# Patient Record
Sex: Male | Born: 1965 | Race: Black or African American | Hispanic: No | Marital: Married | Smoking: Former smoker
Health system: Southern US, Community
[De-identification: ages and names within clinical notes are randomized; demographics above are authoritative.]

## PROBLEM LIST (undated history)

## (undated) DIAGNOSIS — K8020 Calculus of gallbladder without cholecystitis without obstruction: Secondary | ICD-10-CM

## (undated) DIAGNOSIS — M1990 Unspecified osteoarthritis, unspecified site: Secondary | ICD-10-CM

## (undated) DIAGNOSIS — K859 Acute pancreatitis without necrosis or infection, unspecified: Secondary | ICD-10-CM

## (undated) DIAGNOSIS — I10 Essential (primary) hypertension: Secondary | ICD-10-CM

## (undated) DIAGNOSIS — E119 Type 2 diabetes mellitus without complications: Secondary | ICD-10-CM

## (undated) HISTORY — PX: NO PAST SURGERIES: SHX2092

## (undated) HISTORY — PX: ROTATOR CUFF REPAIR: SHX139

## (undated) HISTORY — DX: Type 2 diabetes mellitus without complications: E11.9

## (undated) HISTORY — DX: Acute pancreatitis without necrosis or infection, unspecified: K85.90

## (undated) HISTORY — DX: Essential (primary) hypertension: I10

---

## 2003-03-20 ENCOUNTER — Emergency Department (HOSPITAL_COMMUNITY): Admission: EM | Admit: 2003-03-20 | Discharge: 2003-03-20 | Payer: Self-pay | Admitting: Emergency Medicine

## 2004-06-26 ENCOUNTER — Emergency Department (HOSPITAL_COMMUNITY): Admission: EM | Admit: 2004-06-26 | Discharge: 2004-06-26 | Payer: Self-pay | Admitting: Emergency Medicine

## 2004-08-13 ENCOUNTER — Emergency Department (HOSPITAL_COMMUNITY): Admission: EM | Admit: 2004-08-13 | Discharge: 2004-08-14 | Payer: Self-pay | Admitting: Emergency Medicine

## 2004-11-02 ENCOUNTER — Ambulatory Visit: Payer: Self-pay | Admitting: Psychiatry

## 2004-11-02 ENCOUNTER — Inpatient Hospital Stay (HOSPITAL_COMMUNITY): Admission: RE | Admit: 2004-11-02 | Discharge: 2004-11-04 | Payer: Self-pay | Admitting: Psychiatry

## 2004-11-02 ENCOUNTER — Emergency Department (HOSPITAL_COMMUNITY): Admission: EM | Admit: 2004-11-02 | Discharge: 2004-11-02 | Payer: Self-pay | Admitting: Emergency Medicine

## 2004-11-22 ENCOUNTER — Emergency Department (HOSPITAL_COMMUNITY): Admission: EM | Admit: 2004-11-22 | Discharge: 2004-11-22 | Payer: Self-pay | Admitting: Emergency Medicine

## 2004-12-02 ENCOUNTER — Emergency Department (HOSPITAL_COMMUNITY): Admission: EM | Admit: 2004-12-02 | Discharge: 2004-12-02 | Payer: Self-pay | Admitting: Emergency Medicine

## 2011-09-13 ENCOUNTER — Emergency Department (HOSPITAL_COMMUNITY)
Admission: EM | Admit: 2011-09-13 | Discharge: 2011-09-13 | Disposition: A | Discharge status: Home / Self Care | Payer: Managed Care, Other (non HMO) | Attending: Emergency Medicine | Admitting: Emergency Medicine

## 2011-09-13 ENCOUNTER — Encounter: Payer: Self-pay | Admitting: Emergency Medicine

## 2011-09-13 DIAGNOSIS — R51 Headache: Secondary | ICD-10-CM | POA: Insufficient documentation

## 2011-09-13 MED ORDER — IBUPROFEN 800 MG PO TABS
800.0000 mg | ORAL_TABLET | Freq: Once | ORAL | Status: AC
  Administered 2011-09-13: 800 mg via ORAL
  Filled 2011-09-13: qty 1

## 2011-09-13 MED ORDER — HYDROCODONE-ACETAMINOPHEN 5-325 MG PO TABS: 1.0000 | ORAL_TABLET | Freq: Four times a day (QID) | ORAL | Status: AC | PRN

## 2011-09-13 NOTE — ED Provider Notes (Signed)
History     CSN: [id number] Arrival date & time: 09/13/2011  5:03 AM   First MD Initiated Contact with Patient 09/13/11 [phone]      Chief Complaint  Patient presents with  . Facial Pain    (Consider location/radiation/quality/duration/timing/severity/associated sxs/prior treatment) HPI This is a 45 year old black male who woke up about 3 AM with severe pain in the left side of his face. The pain was present from the forehead down to the mandible. The pain was of a throbbing nature. It was not exacerbated by palpation or movement of the jaw. There was no associated visual or motor deficit. The pain has subsequently subsided and essentially resolved now. He had a similar episode about 2 weeks ago. He has no history of migraine headaches. He does not have nasal congestion or toothache. He states he does sometimes bleed from his gums.  History reviewed. No pertinent past medical history.  History reviewed. No pertinent past surgical history.  No family history on file.  History  Substance Use Topics  . Smoking status: Current Everyday Smoker -- 1.0 packs/day    Types: Cigarettes  . Smokeless tobacco: Not on file  . Alcohol Use: Yes     occ      Review of Systems  All other systems reviewed and are negative.    Allergies  Review of patient's allergies indicates no known allergies.  Home Medications  No current outpatient prescriptions on file.  BP 151/89  Pulse 86  Temp(Src) 98.5 F (36.9 C) (Oral)  Resp 20  Ht 6\' 1"  (1.854 m)  Wt 300 lb (136.079 kg)  BMI 39.58 kg/m2  SpO2 96%  Physical Exam General: Well-developed, well-nourished male in no acute distress; appearance consistent with age of record HENT: normocephalic, atraumatic; no gross dental caries seen; no facial edema; no sinus tenderness on percussion; TMs normal, tympanostomy tube seen adhering to wax in right external auditory canal Eyes: pupils equal round and reactive to light; extraocular muscles  intact Neck: supple Heart: regular rate and rhythm Lungs: clear to auscultation bilaterally Abdomen: soft; nondistended Extremities: No deformity; full range of motion Neurologic: Awake, alert and oriented; motor function intact in all extremities and symmetric; no facial droop or hyperesthesia Skin: Warm and dry Psychiatric: Normal mood and affect    ED Course  Procedures (including critical care time)    MDM  Differential diagnosis includes atypical trigeminal neuralgia or migraine. No evidence of acute dental or sinus etiology as such pain would not be expected to be intermittent.         Hanley Seamen, MD 09/13/11 [phone]

## 2011-09-13 NOTE — ED Notes (Addendum)
Pt states R side facial pain ? Tooth vs sinus per Pt Pain comes and goes R side of face WNL Hurts worst when pt opens his mouth. Inside of mouth assessment WNL

## 2011-09-13 NOTE — ED Notes (Signed)
Patient states he woke up at 3am and the left side of his face hurt; states he doesn't know if a tooth is bad or if he has a sinus infection.

## 2011-09-13 NOTE — ED Notes (Signed)
Pt stable at discharge ambulatory with steady gait

## 2011-09-13 NOTE — ED Notes (Signed)
Pts pain at 8 Discussed with MD pt is driving, orders given will carry them through and discharge pt

## 2012-04-22 ENCOUNTER — Emergency Department (HOSPITAL_COMMUNITY)
Admission: EM | Admit: 2012-04-22 | Discharge: 2012-04-22 | Disposition: A | Discharge status: Home / Self Care | Payer: Managed Care, Other (non HMO) | Attending: Emergency Medicine | Admitting: Emergency Medicine

## 2012-04-22 ENCOUNTER — Encounter (HOSPITAL_COMMUNITY): Payer: Self-pay | Admitting: *Deleted

## 2012-04-22 DIAGNOSIS — J329 Chronic sinusitis, unspecified: Secondary | ICD-10-CM | POA: Insufficient documentation

## 2012-04-22 DIAGNOSIS — F17200 Nicotine dependence, unspecified, uncomplicated: Secondary | ICD-10-CM | POA: Insufficient documentation

## 2012-04-22 DIAGNOSIS — H109 Unspecified conjunctivitis: Secondary | ICD-10-CM | POA: Insufficient documentation

## 2012-04-22 MED ORDER — TOBRAMYCIN 0.3 % OP SOLN
2.0000 [drp] | Freq: Once | OPHTHALMIC | Status: AC
  Administered 2012-04-22: 2 [drp] via OPHTHALMIC
  Filled 2012-04-22: qty 5

## 2012-04-22 MED ORDER — AMOXICILLIN-POT CLAVULANATE 875-125 MG PO TABS
1.0000 | ORAL_TABLET | Freq: Once | ORAL | Status: AC
  Administered 2012-04-22: 1 via ORAL
  Filled 2012-04-22: qty 1

## 2012-04-22 MED ORDER — AMOXICILLIN-POT CLAVULANATE 875-125 MG PO TABS: 1.0000 | ORAL_TABLET | Freq: Two times a day (BID) | ORAL | Status: AC

## 2012-04-22 NOTE — ED Notes (Signed)
Pt states he has woke up past 3 days with eyes matted shut and drainage from both eyes.

## 2012-04-22 NOTE — ED Notes (Signed)
Patient with no complaints at this time. Respirations even and unlabored. Skin warm/dry. Discharge instructions reviewed with patient at this time. Patient given opportunity to voice concerns/ask questions. Patient discharged at this time and left Emergency Department with steady gait.   

## 2012-04-22 NOTE — ED Provider Notes (Signed)
History     CSN: [id number]  Arrival date & time 04/22/12  1147   First MD Initiated Contact with Patient 04/22/12 1229      Chief Complaint  Patient presents with  . Eye Drainage    (Consider location/radiation/quality/duration/timing/severity/associated sxs/prior treatment) HPI Comments: Pt has had sinus pressure and B eyes are draining and matting up the mornings.  Uses a warm wash cloth to "get my eyes open".  No fever or chills.  + post nasal drip.  The history is provided by the patient. No language interpreter was used.    History reviewed. No pertinent past medical history.  History reviewed. No pertinent past surgical history.  No family history on file.  History  Substance Use Topics  . Smoking status: Current Everyday Smoker -- 1.0 packs/day    Types: Cigarettes  . Smokeless tobacco: Not on file  . Alcohol Use: Yes     occ      Review of Systems  Constitutional: Negative for fever and chills.  HENT: Positive for postnasal drip.   Eyes: Positive for discharge and itching. Negative for photophobia, pain, redness and visual disturbance.  All other systems reviewed and are negative.    Allergies  Review of patient's allergies indicates no known allergies.  Home Medications   Current Outpatient Rx  Name Route Sig Dispense Refill  . BROMPHENIRAMINE-PSEUDOEPH 1-15 MG/5ML PO ELIX Oral Take 10 mLs by mouth 2 (two) times daily as needed. For cough/cold    . AMOXICILLIN-POT CLAVULANATE 875-125 MG PO TABS Oral Take 1 tablet by mouth every 12 (twelve) hours. 14 tablet 0    BP 146/97  Pulse 98  Temp 98.8 F (37.1 C) (Oral)  Resp 18  Ht 6\' 1"  (1.854 m)  Wt 330 lb (149.687 kg)  BMI 43.54 kg/m2  Physical Exam  Nursing note and vitals reviewed. Constitutional: He is oriented to person, place, and time. He appears well-developed and well-nourished.  HENT:  Head: Normocephalic and atraumatic.         Pt's voice sounds very nasal.    Frequently sniffing to  clear post nasal d/c   Eyes: Conjunctivae, EOM and lids are normal. Pupils are equal, round, and reactive to light. Right eye exhibits no discharge and no exudate. Left eye exhibits no discharge and no exudate. No scleral icterus.       Pt describes purulent d/c in mornings that he uses a wash cloth to remove.  No d/c at exam time.  Neck: Normal range of motion.  Cardiovascular: Normal rate, regular rhythm, normal heart sounds and intact distal pulses.   Pulmonary/Chest: Effort normal and breath sounds normal. No respiratory distress.  Abdominal: Soft. He exhibits no distension. There is no tenderness.  Musculoskeletal: Normal range of motion.  Neurological: He is alert and oriented to person, place, and time.  Skin: Skin is warm and dry.  Psychiatric: He has a normal mood and affect. Judgment normal.    ED Course  Procedures (including critical care time)  Labs Reviewed - No data to display No results found.   1. Sinusitis   2. Conjunctivitis of both eyes       MDM  Seasonal allergies vs sinusitis.        Evalina Field, PA 04/22/12 1417  Evalina Field, PA 04/22/12 1648  Evalina Field, PA 04/22/12 [phone]

## 2012-04-22 NOTE — ED Notes (Signed)
Pt. Presents w/1 wk of sinus congestion and 3-4 days of eye drainage that webs eyelids shut in the morning. Also has had productive cough of light green thick secretions.   Warm wet rag works for removing drainage.  Has taken Dimetapp for congestion, but has not done much for relief of symptoms.

## 2012-04-23 NOTE — ED Provider Notes (Signed)
Medical screening examination/treatment/procedure(s) were performed by non-physician practitioner and as supervising physician I was immediately available for consultation/collaboration.   Joya Gaskins, MD 04/23/12 [phone]

## 2013-05-08 ENCOUNTER — Emergency Department (HOSPITAL_COMMUNITY)
Admission: EM | Admit: 2013-05-08 | Discharge: 2013-05-08 | Disposition: A | Discharge status: Home / Self Care | Payer: 59 | Attending: Emergency Medicine | Admitting: Emergency Medicine

## 2013-05-08 ENCOUNTER — Emergency Department (HOSPITAL_COMMUNITY): Payer: 59

## 2013-05-08 ENCOUNTER — Encounter (HOSPITAL_COMMUNITY): Payer: Self-pay | Admitting: Emergency Medicine

## 2013-05-08 DIAGNOSIS — F17200 Nicotine dependence, unspecified, uncomplicated: Secondary | ICD-10-CM | POA: Insufficient documentation

## 2013-05-08 DIAGNOSIS — M549 Dorsalgia, unspecified: Secondary | ICD-10-CM | POA: Insufficient documentation

## 2013-05-08 DIAGNOSIS — K8020 Calculus of gallbladder without cholecystitis without obstruction: Secondary | ICD-10-CM | POA: Insufficient documentation

## 2013-05-08 DIAGNOSIS — R0602 Shortness of breath: Secondary | ICD-10-CM | POA: Insufficient documentation

## 2013-05-08 LAB — CBC WITH DIFFERENTIAL/PLATELET
Basophils Absolute: 0 10*3/uL (ref 0.0–0.1)
Basophils Relative: 0 % (ref 0–1)
Eosinophils Absolute: 0.1 10*3/uL (ref 0.0–0.7)
Eosinophils Relative: 0 % (ref 0–5)
HCT: 44.4 % (ref 39.0–52.0)
Hemoglobin: 15 g/dL (ref 13.0–17.0)
Lymphocytes Relative: 10 % — ABNORMAL LOW (ref 12–46)
Lymphs Abs: 1.3 10*3/uL (ref 0.7–4.0)
MCH: 28.2 pg (ref 26.0–34.0)
MCHC: 33.8 g/dL (ref 30.0–36.0)
MCV: 83.5 fL (ref 78.0–100.0)
Monocytes Absolute: 0.6 10*3/uL (ref 0.1–1.0)
Monocytes Relative: 4 % (ref 3–12)
Neutro Abs: 10.8 10*3/uL — ABNORMAL HIGH (ref 1.7–7.7)
Neutrophils Relative %: 85 % — ABNORMAL HIGH (ref 43–77)
Platelets: 256 10*3/uL (ref 150–400)
RBC: 5.32 MIL/uL (ref 4.22–5.81)
RDW: 14.6 % (ref 11.5–15.5)
WBC: 12.7 10*3/uL — ABNORMAL HIGH (ref 4.0–10.5)

## 2013-05-08 LAB — COMPREHENSIVE METABOLIC PANEL
ALT: 628 U/L — ABNORMAL HIGH (ref 0–53)
AST: 755 U/L — ABNORMAL HIGH (ref 0–37)
Albumin: 4.1 g/dL (ref 3.5–5.2)
Alkaline Phosphatase: 193 U/L — ABNORMAL HIGH (ref 39–117)
BUN: 12 mg/dL (ref 6–23)
CO2: 26 mEq/L (ref 19–32)
Calcium: 9.9 mg/dL (ref 8.4–10.5)
Chloride: 100 mEq/L (ref 96–112)
Creatinine, Ser: 0.9 mg/dL (ref 0.50–1.35)
GFR calc Af Amer: >90 mL/min (ref >90)
GFR calc non Af Amer: >90 mL/min (ref >90)
Glucose, Bld: 132 mg/dL — ABNORMAL HIGH (ref 70–99)
Potassium: 3.8 mEq/L (ref 3.5–5.1)
Sodium: 136 mEq/L (ref 135–145)
Total Bilirubin: 1.2 mg/dL (ref 0.3–1.2)
Total Protein: 8.6 g/dL — ABNORMAL HIGH (ref 6.0–8.3)

## 2013-05-08 LAB — TROPONIN I: Troponin I: <0.3 ng/mL (ref <0.30)

## 2013-05-08 LAB — LIPASE, BLOOD: Lipase: 17 U/L (ref 11–59)

## 2013-05-08 MED ORDER — ASPIRIN 81 MG PO CHEW
324.0000 mg | CHEWABLE_TABLET | Freq: Once | ORAL | Status: AC
  Administered 2013-05-08: 324 mg via ORAL
  Filled 2013-05-08: qty 4

## 2013-05-08 MED ORDER — TRAMADOL HCL 50 MG PO TABS: 50.0000 mg | ORAL_TABLET | Freq: Four times a day (QID) | ORAL | Status: DC | PRN

## 2013-05-08 MED ORDER — ONDANSETRON 4 MG PO TBDP
ORAL_TABLET | ORAL | Status: AC
  Administered 2013-05-08: 4 mg via ORAL
  Filled 2013-05-08: qty 1

## 2013-05-08 MED ORDER — SODIUM CHLORIDE 0.9 % IV SOLN
INTRAVENOUS | Status: DC
  Administered 2013-05-08: 11:00:00 via INTRAVENOUS

## 2013-05-08 MED ORDER — PROMETHAZINE HCL 25 MG PO TABS: 25.0000 mg | ORAL_TABLET | Freq: Four times a day (QID) | ORAL | Status: DC | PRN

## 2013-05-08 MED ORDER — SODIUM CHLORIDE 0.9 % IV BOLUS (SEPSIS): 500.0000 mL | Freq: Once | INTRAVENOUS | Status: DC

## 2013-05-08 MED ORDER — HYDROMORPHONE HCL PF 1 MG/ML IJ SOLN
1.0000 mg | Freq: Once | INTRAMUSCULAR | Status: DC
  Filled 2013-05-08: qty 1

## 2013-05-08 MED ORDER — HYDROMORPHONE HCL PF 1 MG/ML IJ SOLN
1.0000 mg | Freq: Once | INTRAMUSCULAR | Status: AC
  Administered 2013-05-08: 1 mg via INTRAMUSCULAR

## 2013-05-08 MED ORDER — PROMETHAZINE HCL 25 MG/ML IJ SOLN: 12.5000 mg | Freq: Once | INTRAMUSCULAR | Status: DC

## 2013-05-08 MED ORDER — ONDANSETRON HCL 4 MG/2ML IJ SOLN
4.0000 mg | Freq: Once | INTRAMUSCULAR | Status: DC
  Filled 2013-05-08: qty 2

## 2013-05-08 MED ORDER — ONDANSETRON 4 MG PO TBDP
4.0000 mg | ORAL_TABLET | Freq: Once | ORAL | Status: AC
  Administered 2013-05-08: 4 mg via ORAL

## 2013-05-08 NOTE — ED Notes (Signed)
CRITICAL VALUE ALERT   Critical value received: trop 0.32  Date of notification:  05/08/2013  Time of notification:  10:51  Critical value read back: yes  Nurse who received alert:  Juliette Alcide   MD notified (1st page):  Dr. Deretha Emory  Time of first page:  10:51  MD notified (2nd page):  Time of second page:  Responding MD:  Dr. Deretha Emory  Time MD responded:  10:51

## 2013-05-08 NOTE — ED Notes (Addendum)
Dr. Deretha Emory notified of erroneous Troponin results. Sample retested w/result of 0.03.

## 2013-05-08 NOTE — ED Notes (Signed)
Patient with no complaints at this time. Respirations even and unlabored. Skin warm/dry. Discharge instructions reviewed with patient at this time. Patient given opportunity to voice concerns/ask questions. IV removed per policy and band-aid applied to site. Patient discharged at this time and left Emergency Department with steady gait.  

## 2013-05-08 NOTE — ED Notes (Signed)
Pt c/o mid abd pain started last night that radiates around back with sob. Denies n/v/d. lnbm last night. Denies black or bloody stools. Denies urinary changes/cough/congestion/dizziness. C/o soreness and pressure to abd.

## 2013-05-08 NOTE — ED Provider Notes (Addendum)
History  This chart was scribed for Shelda Jakes, MD, by Yevette Edwards, ED Scribe. This patient was seen in room [facility] and the patient's care was started at 9:25 AM.   CSN: [id number] Arrival date & time 05/08/13  0909  First MD Initiated Contact with Patient 05/08/13 [phone]     Chief Complaint  Patient presents with  . Abdominal Pain    Patient is a 47 y.o. male presenting with abdominal pain. The history is provided by the patient. No language interpreter was used.  Abdominal Pain This is a new problem. The current episode started 12 to 24 hours ago. The problem occurs constantly. The problem has been gradually worsening. Associated symptoms include abdominal pain and shortness of breath. Pertinent negatives include no chest pain and no headaches. Nothing aggravates the symptoms. Nothing relieves the symptoms. He has tried nothing for the symptoms.   HPI Comments: MAYFORD ALBERG is a 47 y.o. male who presents to the Emergency Department complaining of constant, gradually- increasing upper abdominal pain which began approximately at 11 pm yesterday evening. He states that the abdominal pain radiates to his back, and he describes the pain as "soreness" and "pressure."  He also states that he has experienced SOB when he takes a deep breath as an associated symptom. The pt denies experiencing any emesis, diarrhea, dysuria, hematochezia, cough, congestion, or dizziness. The pt has a h/o prior episodes of similar symptoms, and he states that he was told the previous episodes were caused by reflux. However, he denies being presrcibed any reflux medicine. He also denies having any imaging of his gallbladder. The pt is a daily smoker, and he uses alcohol.    Terie Purser with [facility] is Pt's P.A.   History reviewed. No pertinent past medical history. History reviewed. No pertinent past surgical history. History reviewed. No pertinent family history. History  Substance Use  Topics  . Smoking status: Current Every Day Smoker -- 1.00 packs/day    Types: Cigarettes  . Smokeless tobacco: Not on file  . Alcohol Use: Yes     Comment: occ    Review of Systems  Constitutional: Negative for fever and chills.  HENT: Negative for congestion, sore throat and rhinorrhea.   Respiratory: Positive for shortness of breath. Negative for cough.   Cardiovascular: Negative for chest pain and leg swelling.  Gastrointestinal: Positive for abdominal pain. Negative for nausea, vomiting, diarrhea and blood in stool.  Genitourinary: Negative for dysuria.  Musculoskeletal: Positive for back pain.  Skin: Negative for rash.  Neurological: Negative for dizziness and headaches.  Hematological: Does not bruise/bleed easily.  All other systems reviewed and are negative.    Allergies  Review of patient's allergies indicates no known allergies.  Home Medications   Current Outpatient Rx  Name  Route  Sig  Dispense  Refill  . promethazine (PHENERGAN) 25 MG tablet   Oral   Take 1 tablet (25 mg total) by mouth every 6 (six) hours as needed for nausea.   12 tablet   0   . traMADol (ULTRAM) 50 MG tablet   Oral   Take 1 tablet (50 mg total) by mouth every 6 (six) hours as needed.   20 tablet   0    Triage Vitals: BP 135/75  Pulse 80  Temp(Src) 97.7 F (36.5 C) (Oral)  Resp 14  Ht 6' (1.829 m)  Wt 280 lb (127.007 kg)  BMI 37.97 kg/m2  SpO2 100%  Physical Exam  Nursing note  and vitals reviewed. Constitutional: He is oriented to person, place, and time. He appears well-developed and well-nourished. No distress.  HENT:  Head: Normocephalic and atraumatic.  Eyes: EOM are normal.  Neck: Neck supple. No tracheal deviation present.  Cardiovascular: Normal rate, regular rhythm and normal heart sounds.   No murmur heard. Pulmonary/Chest: Effort normal and breath sounds normal. No respiratory distress.  Abdominal: Soft. Bowel sounds are normal. There is tenderness.  Bowel  sounds decreased, but present. Tenderness to bilateral upper quadrant abdominal.  Musculoskeletal: Normal range of motion.  Neurological: He is alert and oriented to person, place, and time. He has normal reflexes. He displays normal reflexes. No cranial nerve deficit. Coordination normal.  Skin: Skin is warm and dry.  Psychiatric: He has a normal mood and affect. His behavior is normal.    ED Course  Procedures (including critical care time)  DIAGNOSTIC STUDIES: Oxygen Saturation is 100% on room air, normal by my interpretation.    COORDINATION OF CARE:  9:31 AM-Discussed treatment plan with patient which includes imaging and pain medication, and the patient agreed to the plan.   12:08 PM- Rechecked pt. Informed him of this results.   Labs Reviewed  COMPREHENSIVE METABOLIC PANEL - Abnormal; Notable for the following:    Glucose, Bld 132 (*)    Total Protein 8.6 (*)    AST 755 (*)    ALT 628 (*)    Alkaline Phosphatase 193 (*)    All other components within normal limits  CBC WITH DIFFERENTIAL - Abnormal; Notable for the following:    WBC 12.7 (*)    Neutrophils Relative % 85 (*)    Neutro Abs 10.8 (*)    Lymphocytes Relative 10 (*)    All other components within normal limits  LIPASE, BLOOD  TROPONIN I  TROPONIN I   Dg Chest 2 View  05/08/2013   *RADIOLOGY REPORT*  Clinical Data: abdominal pain, shortness of breath  CHEST - 2 VIEW  Comparison: None.  Findings: Heart is normal size.  Mild peribronchial thickening.  No confluent opacities or effusions.  No acute bony abnormality.  IMPRESSION: Mild bronchitic changes.   Original Report Authenticated By: Charlett Nose, M.D.   US Abdomen Complete  05/08/2013   *RADIOLOGY REPORT*  Clinical Data:  Abdominal pain  ABDOMINAL ULTRASOUND COMPLETE  Comparison:  None.  Findings:  Gallbladder:  Numerous echogenic shadowing intraluminal gallstones. Wall thickness is mildly thickened measuring 3.9 mm.  Despite this, no Murphy's sign elicited  during the study.  Common Bile Duct:  Within normal limits in caliber.  Liver: Mild increase echogenicity.  Compatible with fatty infiltration.  No focal hepatic abnormality or biliary dilatation. Portal vein is patent with normal hepato pedal flow.  IVC:  Appears normal.  Pancreas:  No abnormality identified.  Spleen:  Within normal limits in size and echotexture.  Right kidney:  Normal in size and parenchymal echogenicity.  No evidence of mass or hydronephrosis.  Left kidney:  Normal in size and parenchymal echogenicity.  No evidence of mass or hydronephrosis.  Abdominal Aorta:  No aneurysm identified.  IMPRESSION: Cholelithiasis with slight gallbladder wall thickening but no Murphy's sign.  Hepatic steatosis  No biliary dilatation   Original Report Authenticated By: Judie Petit. Miles Costain, M.D.     Date: 05/08/2013  Rate: 77  Rhythm: normal sinus rhythm  QRS Axis: normal  Intervals: normal  ST/T Wave abnormalities: nonspecific T wave changes  Conduction Disutrbances:none  Narrative Interpretation:   Old EKG Reviewed: none available  Results for orders placed during the hospital encounter of 05/08/13  COMPREHENSIVE METABOLIC PANEL      Result Value Range   Sodium 136  135 - 145 mEq/L   Potassium 3.8  3.5 - 5.1 mEq/L   Chloride 100  96 - 112 mEq/L   CO2 26  19 - 32 mEq/L   Glucose, Bld 132 (*) 70 - 99 mg/dL   BUN 12  6 - 23 mg/dL   Creatinine, Ser 1.19  0.50 - 1.35 mg/dL   Calcium 9.9  8.4 - 14.7 mg/dL   Total Protein 8.6 (*) 6.0 - 8.3 g/dL   Albumin 4.1  3.5 - 5.2 g/dL   AST 829 (*) 0 - 37 U/L   ALT 628 (*) 0 - 53 U/L   Alkaline Phosphatase 193 (*) 39 - 117 U/L   Total Bilirubin 1.2  0.3 - 1.2 mg/dL   GFR calc non Af Amer >90  >90 mL/min   GFR calc Af Amer >90  >90 mL/min  LIPASE, BLOOD      Result Value Range   Lipase 17  11 - 59 U/L  CBC WITH DIFFERENTIAL      Result Value Range   WBC 12.7 (*) 4.0 - 10.5 K/uL   RBC 5.32  4.22 - 5.81 MIL/uL   Hemoglobin 15.0  13.0 - 17.0 g/dL   HCT 56.2   13.0 - 86.5 %   MCV 83.5  78.0 - 100.0 fL   MCH 28.2  26.0 - 34.0 pg   MCHC 33.8  30.0 - 36.0 g/dL   RDW 78.4  69.6 - 29.5 %   Platelets 256  150 - 400 K/uL   Neutrophils Relative % 85 (*) 43 - 77 %   Neutro Abs 10.8 (*) 1.7 - 7.7 K/uL   Lymphocytes Relative 10 (*) 12 - 46 %   Lymphs Abs 1.3  0.7 - 4.0 K/uL   Monocytes Relative 4  3 - 12 %   Monocytes Absolute 0.6  0.1 - 1.0 K/uL   Eosinophils Relative 0  0 - 5 %   Eosinophils Absolute 0.1  0.0 - 0.7 K/uL   Basophils Relative 0  0 - 1 %   Basophils Absolute 0.0  0.0 - 0.1 K/uL  TROPONIN I      Result Value Range   Troponin I <0.30  <0.30 ng/mL       1. Cholelithiases     MDM  Patient symptoms consistent with biliary colic ultrasound confirms gallstones with gallbladder thickening. No elevation in bilirubin no elevation in lipase no evidence of pancreatitis. Patient's pain is resolved abdomen is soft and nontender. Patient can followup with general surgery for elect removal of the gallbladder. The concern was that the patient's troponin was originally reported as elevated clinically do not make sense was going to repeat it but they determined that the first troponin was a runny [place] and debris Harvie Heck original blood and the troponin was negative.  Elevation in transaminases noted. Do not feel that this is consistent with hepatitis.   I personally performed the services described in this documentation, which was scribed in my presence. The recorded information has been reviewed and is accurate.     Shelda Jakes, MD 05/08/13 1216  Shelda Jakes, MD 05/08/13 [phone]

## 2013-06-21 NOTE — Patient Instructions (Addendum)
Timothy Dalton  06/21/2013   Your procedure is scheduled on:   06/29/2013  Report to [facility] at  615  AM.  Call this number if you have problems the morning of surgery: [phone]   Remember:   Do not eat food or drink liquids after midnight.   Take these medicines the morning of surgery with A SIP OF WATER:  Phnergan, ultram   Do not wear jewelry, make-up or nail polish.  Do not wear lotions, powders, or perfumes.   Do not shave 48 hours prior to surgery. Men may shave face and neck.  Do not bring valuables to the hospital.  [facility] is not responsible  for any belongings or valuables.  Contacts, dentures or bridgework may not be worn into surgery.  Leave suitcase in the car. After surgery it may be brought to your room.  For patients admitted to the hospital, checkout time is 11:00 AM the day of discharge.   Patients discharged the day of surgery will not be allowed to drive home.  Name and phone number of your driver: family  Special Instructions: Shower using CHG 2 nights before surgery and the night before surgery.  If you shower the day of surgery use CHG.  Use special wash - you have one bottle of CHG for all showers.  You should use approximately 1/3 of the bottle for each shower.   Please read over the following fact sheets that you were given: Pain Booklet, Coughing and Deep Breathing, Surgical Site Infection Prevention, Anesthesia Post-op Instructions and Care and Recovery After Surgery Laparoscopic Cholecystectomy Laparoscopic cholecystectomy is surgery to remove the gallbladder. The gallbladder is located slightly to the right of center in the abdomen, behind the liver. It is a concentrating and storage sac for the bile produced in the liver. Bile aids in the digestion and absorption of fats. Gallbladder disease (cholecystitis) is an inflammation of your gallbladder. This condition is usually caused by a buildup of gallstones (cholelithiasis) in your gallbladder.  Gallstones can block the flow of bile, resulting in inflammation and pain. In severe cases, emergency surgery may be required. When emergency surgery is not required, you will have time to prepare for the procedure. Laparoscopic surgery is an alternative to open surgery. Laparoscopic surgery usually has a shorter recovery time. Your common bile duct may also need to be examined and explored. Your caregiver will discuss this with you if he or she feels this should be done. If stones are found in the common bile duct, they may be removed. LET YOUR CAREGIVER KNOW ABOUT:  Allergies to food or medicine.  Medicines taken, including vitamins, herbs, eyedrops, over-the-counter medicines, and creams.  Use of steroids (by mouth or creams).  Previous problems with anesthetics or numbing medicines.  History of bleeding problems or blood clots.  Previous surgery.  Other health problems, including diabetes and kidney problems.  Possibility of pregnancy, if this applies. RISKS AND COMPLICATIONS All surgery is associated with risks. Some problems that may occur following this procedure include:  Infection.  Damage to the common bile duct, nerves, arteries, veins, or other internal organs such as the stomach or intestines.  Bleeding.  A stone may remain in the common bile duct. BEFORE THE PROCEDURE  Do not take aspirin for 3 days prior to surgery or blood thinners for 1 week prior to surgery.  Do not eat or drink anything after midnight the night before surgery.  Let your caregiver  know if you develop a cold or other infectious problem prior to surgery.  You should be present 60 minutes before the procedure or as directed. PROCEDURE  You will be given medicine that makes you sleep (general anesthetic). When you are asleep, your surgeon will make several small cuts (incisions) in your abdomen. One of these incisions is used to insert a small, lighted scope (laparoscope) into the abdomen. The  laparoscope helps the surgeon see into your abdomen. Carbon dioxide gas will be pumped into your abdomen. The gas allows more room for the surgeon to perform your surgery. Other operating instruments are inserted through the other incisions. Laparoscopic procedures may not be appropriate when:  There is major scarring from previous surgery.  The gallbladder is extremely inflamed.  There are bleeding disorders or unexpected cirrhosis of the liver.  A pregnancy is near term.  Other conditions make the laparoscopic procedure impossible. If your surgeon feels it is not safe to continue with a laparoscopic procedure, he or she will perform an open abdominal procedure. In this case, the surgeon will make an incision to open the abdomen. This gives the surgeon a larger view and field to work within. This may allow the surgeon to perform procedures that sometimes cannot be performed with a laparoscope alone. Open surgery has a longer recovery time. AFTER THE PROCEDURE  You will be taken to the recovery area where a nurse will watch and check your progress.  You may be allowed to go home the same day.  Do not resume physical activities until directed by your caregiver.  You may resume a normal diet and activities as directed. Document Released: 10/04/2005 Document Revised: 12/27/2011 Document Reviewed: 03/19/2011 [facility] Patient Information 2014 [place], [place]. PATIENT INSTRUCTIONS POST-ANESTHESIA  IMMEDIATELY FOLLOWING SURGERY:  Do not drive or operate machinery for the first twenty four hours after surgery.  Do not make any important decisions for twenty four hours after surgery or while taking narcotic pain medications or sedatives.  If you develop intractable nausea and vomiting or a severe headache please notify your doctor immediately.  FOLLOW-UP:  Please make an appointment with your surgeon as instructed. You do not need to follow up with anesthesia unless specifically instructed to do  so.  WOUND CARE INSTRUCTIONS (if applicable):  Keep a dry clean dressing on the anesthesia/puncture wound site if there is drainage.  Once the wound has quit draining you may leave it open to air.  Generally you should leave the bandage intact for twenty four hours unless there is drainage.  If the epidural site drains for more than 36-48 hours please call the anesthesia department.  QUESTIONS?:  Please feel free to call your physician or the hospital operator if you have any questions, and they will be happy to assist you.

## 2013-06-22 ENCOUNTER — Encounter (HOSPITAL_COMMUNITY)
Admission: RE | Admit: 2013-06-22 | Discharge: 2013-06-22 | Disposition: A | Discharge status: Home / Self Care | Payer: Managed Care, Other (non HMO) | Source: Ambulatory Visit | Attending: Family Medicine | Admitting: Family Medicine

## 2013-06-29 ENCOUNTER — Ambulatory Visit (HOSPITAL_COMMUNITY): Admission: RE | Admit: 2013-06-29 | Payer: 59 | Source: Ambulatory Visit | Admitting: General Surgery

## 2013-06-29 ENCOUNTER — Encounter (HOSPITAL_COMMUNITY): Admission: RE | Payer: Self-pay | Source: Ambulatory Visit

## 2013-06-29 SURGERY — LAPAROSCOPIC CHOLECYSTECTOMY
Anesthesia: General

## 2016-04-22 ENCOUNTER — Encounter (HOSPITAL_COMMUNITY): Payer: Self-pay | Admitting: Emergency Medicine

## 2016-04-22 ENCOUNTER — Emergency Department (HOSPITAL_COMMUNITY): Payer: BLUE CROSS/BLUE SHIELD

## 2016-04-22 ENCOUNTER — Inpatient Hospital Stay (HOSPITAL_COMMUNITY)
Admission: EM | Admit: 2016-04-22 | Discharge: 2016-04-24 | DRG: 439 | Disposition: A | Discharge status: Home / Self Care | Payer: BLUE CROSS/BLUE SHIELD | Attending: Internal Medicine | Admitting: Internal Medicine

## 2016-04-22 DIAGNOSIS — D649 Anemia, unspecified: Secondary | ICD-10-CM | POA: Diagnosis present

## 2016-04-22 DIAGNOSIS — K852 Alcohol induced acute pancreatitis without necrosis or infection: Secondary | ICD-10-CM | POA: Diagnosis not present

## 2016-04-22 DIAGNOSIS — R1013 Epigastric pain: Secondary | ICD-10-CM | POA: Diagnosis not present

## 2016-04-22 DIAGNOSIS — K859 Acute pancreatitis without necrosis or infection, unspecified: Secondary | ICD-10-CM | POA: Diagnosis present

## 2016-04-22 DIAGNOSIS — Z6841 Body Mass Index (BMI) 40.0 and over, adult: Secondary | ICD-10-CM

## 2016-04-22 DIAGNOSIS — R10816 Epigastric abdominal tenderness: Secondary | ICD-10-CM | POA: Diagnosis not present

## 2016-04-22 DIAGNOSIS — F17210 Nicotine dependence, cigarettes, uncomplicated: Secondary | ICD-10-CM | POA: Diagnosis present

## 2016-04-22 DIAGNOSIS — K851 Biliary acute pancreatitis without necrosis or infection: Secondary | ICD-10-CM | POA: Diagnosis present

## 2016-04-22 DIAGNOSIS — K8511 Biliary acute pancreatitis with uninfected necrosis: Secondary | ICD-10-CM | POA: Diagnosis not present

## 2016-04-22 DIAGNOSIS — R7989 Other specified abnormal findings of blood chemistry: Secondary | ICD-10-CM | POA: Diagnosis not present

## 2016-04-22 DIAGNOSIS — R945 Abnormal results of liver function studies: Secondary | ICD-10-CM

## 2016-04-22 DIAGNOSIS — K858 Other acute pancreatitis without necrosis or infection: Secondary | ICD-10-CM | POA: Diagnosis not present

## 2016-04-22 DIAGNOSIS — K8020 Calculus of gallbladder without cholecystitis without obstruction: Secondary | ICD-10-CM | POA: Diagnosis present

## 2016-04-22 HISTORY — DX: Calculus of gallbladder without cholecystitis without obstruction: K80.20

## 2016-04-22 LAB — URINE MICROSCOPIC-ADD ON

## 2016-04-22 LAB — URINALYSIS, ROUTINE W REFLEX MICROSCOPIC
GLUCOSE, UA: NEGATIVE mg/dL
Ketones, ur: NEGATIVE mg/dL
LEUKOCYTES UA: NEGATIVE
Nitrite: NEGATIVE
PH: 5.5 (ref 5.0–8.0)
Specific Gravity, Urine: 1.025 (ref 1.005–1.030)

## 2016-04-22 LAB — I-STAT CG4 LACTIC ACID, ED
Lactic Acid, Venous: 1.23 mmol/L (ref 0.5–1.9)
Lactic Acid, Venous: 0.8 mmol/L (ref 0.5–1.9)

## 2016-04-22 LAB — COMPREHENSIVE METABOLIC PANEL
ALBUMIN: 3.9 g/dL (ref 3.5–5.0)
ALT: 578 U/L — ABNORMAL HIGH (ref 17–63)
AST: 451 U/L — AB (ref 15–41)
Alkaline Phosphatase: 169 U/L — ABNORMAL HIGH (ref 38–126)
Anion gap: 8 (ref 5–15)
BILIRUBIN TOTAL: 4.1 mg/dL — AB (ref 0.3–1.2)
BUN: 13 mg/dL (ref 6–20)
CHLORIDE: 103 mmol/L (ref 101–111)
CO2: 27 mmol/L (ref 22–32)
Calcium: 9.5 mg/dL (ref 8.9–10.3)
Creatinine, Ser: 0.77 mg/dL (ref 0.61–1.24)
GFR calc Af Amer: >60 mL/min (ref >60)
GFR calc non Af Amer: >60 mL/min (ref >60)
GLUCOSE: 105 mg/dL — AB (ref 65–99)
POTASSIUM: 3.4 mmol/L — AB (ref 3.5–5.1)
Sodium: 138 mmol/L (ref 135–145)
TOTAL PROTEIN: 8 g/dL (ref 6.5–8.1)

## 2016-04-22 LAB — CBC WITH DIFFERENTIAL/PLATELET
Basophils Absolute: 0 10*3/uL (ref 0.0–0.1)
Basophils Relative: 0 %
Eosinophils Absolute: 0.1 10*3/uL (ref 0.0–0.7)
Eosinophils Relative: 0 %
HEMATOCRIT: 44.3 % (ref 39.0–52.0)
HEMOGLOBIN: 14.6 g/dL (ref 13.0–17.0)
LYMPHS ABS: 1.3 10*3/uL (ref 0.7–4.0)
Lymphocytes Relative: 9 %
MCH: 27.9 pg (ref 26.0–34.0)
MCHC: 33 g/dL (ref 30.0–36.0)
MCV: 84.5 fL (ref 78.0–100.0)
MONOS PCT: 4 %
Monocytes Absolute: 0.6 10*3/uL (ref 0.1–1.0)
NEUTROS ABS: 12 10*3/uL — AB (ref 1.7–7.7)
NEUTROS PCT: 87 %
Platelets: 274 10*3/uL (ref 150–400)
RBC: 5.24 MIL/uL (ref 4.22–5.81)
RDW: 15.1 % (ref 11.5–15.5)
WBC: 13.9 10*3/uL — ABNORMAL HIGH (ref 4.0–10.5)

## 2016-04-22 LAB — LIPASE, BLOOD: Lipase: 704 U/L — ABNORMAL HIGH (ref 11–51)

## 2016-04-22 MED ORDER — MORPHINE SULFATE (PF) 4 MG/ML IV SOLN
4.0000 mg | INTRAVENOUS | Status: DC | PRN
  Administered 2016-04-22 – 2016-04-24 (×5): 4 mg via INTRAVENOUS
  Filled 2016-04-22 (×6): qty 1

## 2016-04-22 MED ORDER — SODIUM CHLORIDE 0.9 % IV SOLN
INTRAVENOUS | Status: DC
  Administered 2016-04-22: 21:00:00 via INTRAVENOUS

## 2016-04-22 MED ORDER — SODIUM CHLORIDE 0.9 % IV SOLN
Freq: Once | INTRAVENOUS | Status: AC
  Administered 2016-04-22: 17:00:00 via INTRAVENOUS

## 2016-04-22 MED ORDER — ONDANSETRON HCL 4 MG/2ML IJ SOLN
4.0000 mg | Freq: Once | INTRAMUSCULAR | Status: AC
  Administered 2016-04-22: 4 mg via INTRAVENOUS
  Filled 2016-04-22: qty 2

## 2016-04-22 MED ORDER — MORPHINE SULFATE (PF) 4 MG/ML IV SOLN
4.0000 mg | Freq: Once | INTRAVENOUS | Status: AC
  Administered 2016-04-22: 4 mg via INTRAVENOUS
  Filled 2016-04-22: qty 1

## 2016-04-22 MED ORDER — HEPARIN SODIUM (PORCINE) 5000 UNIT/ML IJ SOLN
5000.0000 [IU] | Freq: Three times a day (TID) | INTRAMUSCULAR | Status: DC
  Administered 2016-04-22 – 2016-04-23 (×4): 5000 [IU] via SUBCUTANEOUS
  Filled 2016-04-22 (×4): qty 1

## 2016-04-22 MED ORDER — SODIUM CHLORIDE 0.9 % IV BOLUS (SEPSIS)
1000.0000 mL | Freq: Once | INTRAVENOUS | Status: AC
  Administered 2016-04-22: 1000 mL via INTRAVENOUS

## 2016-04-22 MED ORDER — ONDANSETRON HCL 4 MG PO TABS: 4.0000 mg | ORAL_TABLET | Freq: Four times a day (QID) | ORAL | Status: DC | PRN

## 2016-04-22 MED ORDER — PIPERACILLIN-TAZOBACTAM 3.375 G IVPB: 3.3750 g | Freq: Four times a day (QID) | INTRAVENOUS | Status: DC

## 2016-04-22 MED ORDER — PIPERACILLIN-TAZOBACTAM 3.375 G IVPB
3.3750 g | Freq: Once | INTRAVENOUS | Status: AC
  Administered 2016-04-22: 3.375 g via INTRAVENOUS
  Filled 2016-04-22: qty 50

## 2016-04-22 MED ORDER — ONDANSETRON HCL 4 MG/2ML IJ SOLN: 4.0000 mg | Freq: Four times a day (QID) | INTRAMUSCULAR | Status: DC | PRN

## 2016-04-22 MED ORDER — PIPERACILLIN-TAZOBACTAM 3.375 G IVPB
3.3750 g | Freq: Three times a day (TID) | INTRAVENOUS | Status: DC
  Administered 2016-04-23 – 2016-04-24 (×5): 3.375 g via INTRAVENOUS
  Filled 2016-04-22 (×5): qty 50

## 2016-04-22 MED ORDER — SODIUM CHLORIDE 0.9 % IV SOLN
INTRAVENOUS | Status: DC
  Administered 2016-04-23: 05:00:00 via INTRAVENOUS

## 2016-04-22 NOTE — ED Notes (Signed)
[facility] [place] [facility], went to ED, told he has gallstone with Pancreatitis and needs surgery. Pt wanted to come home for surgery

## 2016-04-22 NOTE — ED Provider Notes (Signed)
CSN: [id number]     Arrival date & time 04/22/16  1605 History   First MD Initiated Contact with Patient 04/22/16 1628     Chief Complaint  Patient presents with  . abnormal xray      (Consider location/radiation/quality/duration/timing/severity/associated sxs/prior Treatment) HPI Comments: Patient presents with epigastric abdominal pain from sleep at 2 AM. This happened while he was on vacation in [place]. He went to the emergency department there was told he had gallstone pancreatitis. He had a CT scan and ultrasound but wanted to come back home for treatment. He denies fever. He states he vomited twice today but it was self-induced. Denies any diarrhea or blood in stool. Denies any urinary symptoms. Denies any chest pain or shortness of breath. Denies any focal weakness, numbness or tingling. No previous abdominal surgeries. Known history of gallstones. Denies excessive alcohol use.  The history is provided by the patient and a relative.    History reviewed. No pertinent past medical history. History reviewed. No pertinent past surgical history. No family history on file. Social History  Substance Use Topics  . Smoking status: Current Every Day Smoker -- 0.50 packs/day    Types: Cigarettes  . Smokeless tobacco: None  . Alcohol Use: Yes     Comment: occ    Review of Systems  Constitutional: Positive for activity change and appetite change. Negative for fever.  HENT: Negative for congestion and rhinorrhea.   Respiratory: Negative for cough, chest tightness, shortness of breath and wheezing.   Cardiovascular: Negative for chest pain and palpitations.  Gastrointestinal: Positive for nausea, vomiting and abdominal pain. Negative for diarrhea.  Genitourinary: Negative for dysuria, urgency, hematuria and testicular pain.  Musculoskeletal: Negative for myalgias and arthralgias.  Skin: Negative for rash.  Neurological: Negative for dizziness, weakness, light-headedness and  headaches.  A complete 10 system review of systems was obtained and all systems are negative except as noted in the HPI and PMH.      Allergies  Review of patient's allergies indicates no known allergies.  Home Medications   Prior to Admission medications   Not on File   BP 125/78 mmHg  Pulse 101  Temp(Src) 98.4 F (36.9 C) (Oral)  Resp 18  Ht 6\' 1"  (1.854 m)  Wt 320 lb (145.151 kg)  BMI 42.23 kg/m2  SpO2 95% Physical Exam  Constitutional: He is oriented to person, place, and time. He appears well-developed and well-nourished. No distress.  obese  HENT:  Head: Normocephalic and atraumatic.  Mouth/Throat: Oropharynx is clear and moist. No oropharyngeal exudate.  Eyes: Conjunctivae and EOM are normal. Pupils are equal, round, and reactive to light.  Neck: Normal range of motion. Neck supple.  No meningismus.  Cardiovascular: Normal rate, regular rhythm, normal heart sounds and intact distal pulses.   No murmur heard. Pulmonary/Chest: Effort normal and breath sounds normal. No respiratory distress.  Abdominal: Soft. There is tenderness. There is no rebound and no guarding.  TTP epigastrium and RUQ.  No guarding or rebound  Musculoskeletal: Normal range of motion. He exhibits no edema or tenderness.  Neurological: He is alert and oriented to person, place, and time. No cranial nerve deficit. He exhibits normal muscle tone. Coordination normal.  No ataxia on finger to nose bilaterally. No pronator drift. 5/5 strength throughout. CN 2-12 intact.Equal grip strength. Sensation intact.   Skin: Skin is warm.  Psychiatric: He has a normal mood and affect. His behavior is normal.  Nursing note and vitals reviewed.   ED Course  Procedures (including critical care time) Labs Review Labs Reviewed  COMPREHENSIVE METABOLIC PANEL - Abnormal; Notable for the following:    Potassium 3.4 (*)    Glucose, Bld 105 (*)    AST 451 (*)    ALT 578 (*)    Alkaline Phosphatase 169 (*)     Total Bilirubin 4.1 (*)    All other components within normal limits  LIPASE, BLOOD - Abnormal; Notable for the following:    Lipase 704 (*)    All other components within normal limits  CBC WITH DIFFERENTIAL/PLATELET - Abnormal; Notable for the following:    WBC 13.9 (*)    Neutro Abs 12.0 (*)    All other components within normal limits  URINALYSIS, ROUTINE W REFLEX MICROSCOPIC (NOT AT [facility])  I-STAT CG4 LACTIC ACID, ED    Imaging Review Dg Abd Acute W/chest  04/22/2016  CLINICAL DATA:  Abdominal pain with gallstones. EXAM: DG ABDOMEN ACUTE W/ 1V CHEST COMPARISON:  Chest radiograph 05/08/2013. Abdominal ultrasound 05/08/2013. FINDINGS: Frontal view of the chest demonstrates midline trachea. Borderline cardiomegaly. Mediastinal contours otherwise within normal limits. No pleural effusion or pneumothorax. Mild lower lobe predominant interstitial thickening. Probable calcified granuloma at the right lung base. No lobar consolidation. Abdominal films demonstrate no free intraperitoneal air or significant air-fluid levels on upright positioning. Contrast filled urinary bladder. No gaseous distention of bowel loops on supine imaging. Moderate amount of colonic stool. IMPRESSION: No acute findings. Chronic pulmonary interstitial thickening, likely related to the clinical history of smoking. Contrast filled bladder. Electronically Signed   By: Jeronimo GreavesKyle  Talbot M.D.   On: 04/22/2016 18:11   I have personally reviewed and evaluated these images and lab results as part of my medical decision-making.   EKG Interpretation   Date/Time:  Thursday April 22 2016 18:22:12 EDT Ventricular Rate:  88 PR Interval:    QRS Duration: 99 QT Interval:  379 QTC Calculation: 459 R Axis:   0 Text Interpretation:  Sinus rhythm No significant change was found  Confirmed by Manus GunningANCOUR  MD, Lukasz Rogus [phone]([id number]) on 04/22/2016 6:28:14 PM      MDM   Final diagnoses:  Gallstone pancreatitis   Upper abdominal pain with nausea and  vomiting. Was told he had gallstone pancreatitis. His CT and ultrasound images on CD.  CT and US reviewed by Dr. Grace IsaacWatts of radiology.  Does show pancreatitis and gallstones.  No CBD dilation.  Labs show normal lactate. Afebrile. Leukocytosis noted with elevated transaminases, bilirubin and lipase. All consistent with gallstone pancreatitis. Doubt ascending cholangitis but we'll cover with Zosyn.  D/w Dr. Jena Gaussourk who agrees with IV hydration and IV admission.  Recommends hospitalist admission and he will see in morning.  Records obtained.  US shows Showing acoustic shadowing gallbladder fossa concerning for small gallstones versus air-filled loop of bowel versus porcelain gallbladder versus emphysematous cholecystitis. Follow-up CT scan showed minimal stranding adjacent to the pancreas. No common bile duct dilation.  Patient stable in the ED.  No peritoneal signs on exam.  Admission dw Dr. Arlean HoppingSchertz.  Dr Jena Gaussourk to consult in AM.  Glynn OctaveStephen Caro Brundidge, MD 04/23/16 [phone]

## 2016-04-22 NOTE — H&P (Signed)
[facility] History and Physical  Timothy PertRicky L Mars ZOX:[id number] DOB: 06/13/1966 DOA: 04/22/2016  Referring physician: Dr. Manus Gunningancour PCP: Terie PurserJACKSON,SAMANTHA, PA-C   Chief Complaint: Abd pain  HPI: Timothy Dalton is a 50 y.o. male with no PMH presented to outside ED this am with abd pain and was found to have pancreatitis and gallstones by CT scan.  He left there and drove home to [place] and came to the ED here.  We are asked to see for admission.    [place] [facility], they had gone out for dinner and were at the hotel about 2 am this morning when pt developed abd pain.  He vomited w/o relief.  Then he couldn't sleep, so his wife called 911 and [facility].  There was told he had pancreatitis and he would need surgery for gallstones.  He said he needed to leave and was dc'd and told to go straight to the hospital when he got back home.     In ED tonight AST and ALT are up 400- 600 range, lipase is ~700, Tbili 4.1 and WBC 14k.  Got IV zosyn in ED.  No fevers here.  ED MD spoke with GI Dr Kendell Baneourke , they will see pt in am.     No PMH, no Rx medications, no DM or HTN.  Lives in [place], went to [employer].  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  +tobacco, occ etoh, no heavy etoh drinking.   ROS  denies CP  no joint pain   no HA  no blurry vision  no rash  no dysuria  no difficulty voiding  no change in urine color    Past Medical History  Past Medical History  Diagnosis Date  . Gallstone    Past Surgical History  Past Surgical History  Procedure Laterality Date  . No past surgeries     Family History History reviewed. No pertinent family history. Social History  reports that he has been smoking Cigarettes.  He has been smoking about 0.50 packs per day. He does not have any smokeless tobacco history on file. He reports that he drinks alcohol. He reports that he does  not use illicit drugs. Allergies No Known Allergies Home medications Prior to Admission medications   Not on File   Liver Function Tests  Recent Labs Lab 04/22/16 1630  AST 451*  ALT 578*  ALKPHOS 169*  BILITOT 4.1*  PROT 8.0  ALBUMIN 3.9    Recent Labs Lab 04/22/16 1630  LIPASE 704*   CBC  Recent Labs Lab 04/22/16 1630  WBC 13.9*  NEUTROABS 12.0*  HGB 14.6  HCT 44.3  MCV 84.5  PLT 274   Basic Metabolic Panel  Recent Labs Lab 04/22/16 1630  NA 138  K 3.4*  CL 103  CO2 27  GLUCOSE 105*  BUN 13  CREATININE 0.77  CALCIUM 9.5     Filed Vitals:   04/22/16 1730 04/22/16 1822 04/22/16 2000 04/22/16 2112  BP: 138/90 124/78 132/83 149/87  Pulse: 88 86 90 92  Temp:    98.6 F (37 C)  TempSrc:    Oral  Resp: 15 16 16 20   Height:    6\' 1"  (1.854 m)  Weight:    146.285 kg (322 lb 8 oz)  SpO2: 96% 98% 92% 95%   Exam: Gen alert, obese AAM, no  distress No rash, cyanosis or gangrene Sclera anicteric, throat clear  No jvd or bruits Chest clear bilat RRR no MRG Abd obese, diffuse mild/mod tenderness, dec'd BS , no ascites GU normal male MS no joint effusions or deformity Ext no LE edema / no wounds or ulcers Neuro is alert, Ox 3 , nf   Na 138 K 3.4  BULN 13  Creat 0.77  Alb 3.9  Lipase 704 AST 451  ALT 578  Tbili 4.1 WBC 13k  Hb 14  plt 274 UA > many bact, 0-5 rbc/ wbc/ epi, 1.025  EKG (independ reviewed) > NSR, no acute changes Per Dr Manus Gunningancour - disc from outside hosp shown to radiologist here >>  US shows acoustic shadowing GB fossa concerning for small gallstones vs air-filled loop of bowel versus porcelain gallbladder versus emphysematous cholecystitis. Follow-up CT scan showed minimal stranding adjacent to the pancreas. No common bile duct dilation.  Assessment: 1.  Abdominal pain / acute pancreatitis / ^LFT's/ +gallstones / hyperbilirubinemia- pt has pancreatitis prob due to gallstones, of mild/ mod severity.  Plan admit, IV pain medications,  NPO, GI to see in am.  Stable clinically.   2.  ^LFT"s/ bili  Plan - as above     Tonnie Friedel D [facility] Pager [phone]  Cell [phone]  If 7PM-7AM, please contact night-coverage [email or web address] Password [email or web address] 04/22/2016, 10:32 PM

## 2016-04-23 ENCOUNTER — Inpatient Hospital Stay (HOSPITAL_COMMUNITY): Payer: BLUE CROSS/BLUE SHIELD

## 2016-04-23 ENCOUNTER — Telehealth: Payer: Self-pay | Admitting: Gastroenterology

## 2016-04-23 ENCOUNTER — Encounter (HOSPITAL_COMMUNITY): Payer: Self-pay | Admitting: Gastroenterology

## 2016-04-23 ENCOUNTER — Encounter: Payer: Self-pay | Admitting: Internal Medicine

## 2016-04-23 DIAGNOSIS — R1013 Epigastric pain: Secondary | ICD-10-CM

## 2016-04-23 DIAGNOSIS — R7989 Other specified abnormal findings of blood chemistry: Secondary | ICD-10-CM

## 2016-04-23 DIAGNOSIS — K858 Other acute pancreatitis without necrosis or infection: Secondary | ICD-10-CM

## 2016-04-23 DIAGNOSIS — K851 Biliary acute pancreatitis without necrosis or infection: Principal | ICD-10-CM

## 2016-04-23 LAB — RAPID URINE DRUG SCREEN, HOSP PERFORMED
AMPHETAMINES: NOT DETECTED
BENZODIAZEPINES: NOT DETECTED
Barbiturates: NOT DETECTED
Cocaine: NOT DETECTED
OPIATES: POSITIVE — AB
Tetrahydrocannabinol: NOT DETECTED

## 2016-04-23 LAB — COMPREHENSIVE METABOLIC PANEL
ALBUMIN: 3.3 g/dL — AB (ref 3.5–5.0)
ALT: 441 U/L — ABNORMAL HIGH (ref 17–63)
AST: 239 U/L — AB (ref 15–41)
Alkaline Phosphatase: 148 U/L — ABNORMAL HIGH (ref 38–126)
Anion gap: 6 (ref 5–15)
BUN: 12 mg/dL (ref 6–20)
CHLORIDE: 107 mmol/L (ref 101–111)
CO2: 28 mmol/L (ref 22–32)
Calcium: 8.5 mg/dL — ABNORMAL LOW (ref 8.9–10.3)
Creatinine, Ser: 0.77 mg/dL (ref 0.61–1.24)
GFR calc Af Amer: >60 mL/min (ref >60)
Glucose, Bld: 94 mg/dL (ref 65–99)
POTASSIUM: 3.2 mmol/L — AB (ref 3.5–5.1)
SODIUM: 141 mmol/L (ref 135–145)
Total Bilirubin: 4.8 mg/dL — ABNORMAL HIGH (ref 0.3–1.2)
Total Protein: 6.9 g/dL (ref 6.5–8.1)

## 2016-04-23 LAB — LIPID PANEL
CHOLESTEROL: 193 mg/dL (ref 0–200)
HDL: 28 mg/dL — ABNORMAL LOW (ref >40)
LDL CALC: 112 mg/dL — AB (ref 0–99)
Total CHOL/HDL Ratio: 6.9 RATIO
Triglycerides: 263 mg/dL — ABNORMAL HIGH (ref <150)
VLDL: 53 mg/dL — AB (ref 0–40)

## 2016-04-23 LAB — CBC
HCT: 40.4 % (ref 39.0–52.0)
Hemoglobin: 12.9 g/dL — ABNORMAL LOW (ref 13.0–17.0)
MCH: 27.3 pg (ref 26.0–34.0)
MCHC: 31.9 g/dL (ref 30.0–36.0)
MCV: 85.4 fL (ref 78.0–100.0)
PLATELETS: 262 10*3/uL (ref 150–400)
RBC: 4.73 MIL/uL (ref 4.22–5.81)
RDW: 15.2 % (ref 11.5–15.5)
WBC: 10.8 10*3/uL — AB (ref 4.0–10.5)

## 2016-04-23 LAB — LIPASE, BLOOD: LIPASE: 168 U/L — AB (ref 11–51)

## 2016-04-23 MED ORDER — POTASSIUM CHLORIDE IN NACL 40-0.9 MEQ/L-% IV SOLN
INTRAVENOUS | Status: DC
  Administered 2016-04-23: 125 mL/h via INTRAVENOUS

## 2016-04-23 MED ORDER — SODIUM CHLORIDE 0.9 % IV SOLN
INTRAVENOUS | Status: DC
  Filled 2016-04-23: qty 1000

## 2016-04-23 MED ORDER — POTASSIUM CHLORIDE IN NACL 20-0.9 MEQ/L-% IV SOLN
INTRAVENOUS | Status: AC
  Administered 2016-04-23 (×2): via INTRAVENOUS

## 2016-04-23 MED ORDER — POTASSIUM CHLORIDE IN NACL 20-0.9 MEQ/L-% IV SOLN
INTRAVENOUS | Status: DC
  Administered 2016-04-23: 23:00:00 via INTRAVENOUS

## 2016-04-23 MED ORDER — PANTOPRAZOLE SODIUM 40 MG PO TBEC
40.0000 mg | DELAYED_RELEASE_TABLET | Freq: Every day | ORAL | Status: DC
  Administered 2016-04-23 – 2016-04-24 (×2): 40 mg via ORAL
  Filled 2016-04-23 (×2): qty 1

## 2016-04-23 NOTE — Telephone Encounter (Signed)
APPT MADE AND LETTER SENT  °

## 2016-04-23 NOTE — Consult Note (Signed)
Referring Provider: Richarda OverlieNayana Abrol, MD Primary Care Physician:  Pershing ProudJACKSON,SAMANTHA, PA-C Primary Gastroenterologist:  Roetta SessionsMichael Rourk, MD  Reason for Consultation:  Gallstone pancreatitis  HPI: Timothy Dalton is a 50 y.o. male with past history of gallstones who presented to the emergency department with abdominal pain. [place] been [place] in [place]. Went out to dinner around 2 AM yesterday morning woke up with abdominal pain and vomiting. [facility] was [facility] and was diagnosed with gallstone pancreatitis. Patient decided to drive home to receive care here locally. [facility] unavailable. When he presented yesterday around 4:30 PM to [facility] his total bilirubin was 4.1, alkaline phosphatase 169, AST 451, ALT 578. Today his numbers showed total bilirubin of 4.8, alkaline phosphatase 148, AST 239, ALT 441. Lipase down from 704 to 168.  In July 2014 he had a similar episode with abdominal pain. Ultrasound noted cholelithiasis with slight gallbladder wall thickening. Lipase was normal during that visit. He did have a significant elevation of LFTs at that time. Patient states he clinically improved and never pursued gallbladder surgery which was recommended electively at that time.  States he has done well except for one single episode in the past 2 years. This was relieved with vomiting. Current episode began 2 AM yesterday morning. Woke up with severe upper abdominal pain associated with vomiting. No fever. He does have frequent heartburn especially nocturnally but does not take anything for it. Bowel movements regular. Did have some bright red blood per rectum last week however. No melena. Regarding alcohol consumption, he may consume a couple of 12 ounce beers once a week. He states he actually had no alcohol on his recent vacation.   CT abd/pelvis with contrast and abd u/s reviewed with radiologist, Dr. Grace IsaacWatts  per Dr. Veronda Prudeanquor's note. Evidence of pancreatitis and gallstones but no common bile duct dilation. Reviewed official reports. Abdominal ultrasound yesterday showed strong acoustic shadowing in the gallbladder fossa, differential including numerous gallstones, porcelain gallbladder, air-filled loop of bowel, common bile duct diameter 3.7 mm. CT abdomen pelvis with contrast performed to further evaluate gallbladder which showed minimal stranding around the pancreas but otherwise unremarkable.  Prior to Admission medications   None  Current Facility-Administered Medications  Medication Dose Route Frequency Provider Last Rate Last Dose  . 0.9 % NaCl with KCl 40 mEq / L  infusion   Intravenous Continuous Richarda OverlieNayana Abrol, MD 125 mL/hr at 04/23/16 0851 125 mL/hr at 04/23/16 0851  . heparin injection 5,000 Units  5,000 Units Subcutaneous Q8H Delano Metzobert Schertz, MD   5,000 Units at 04/23/16 0528  . morphine 4 MG/ML injection 4-6 mg  4-6 mg Intravenous Q4H PRN Delano Metzobert Schertz, MD   4 mg at 04/22/16 2329  . ondansetron (ZOFRAN) tablet 4 mg  4 mg Oral Q6H PRN Delano Metzobert Schertz, MD       Or  . ondansetron [facility](ZOFRAN) injection 4 mg  4 mg Intravenous Q6H PRN Delano Metzobert Schertz, MD      . piperacillin-tazobactam (ZOSYN) IVPB 3.375 g  3.375 g Intravenous Q8H Delano Metzobert Schertz, MD   3.375 g at 04/23/16 0851    Allergies as of 04/22/2016  . (No Known Allergies)    Past Medical History  Diagnosis Date  . Gallstone     Past Surgical History  Procedure Laterality Date  . No past surgeries      Family History  Problem Relation Age of Onset  . Colon cancer Neg  Hx   . Pancreatic disease Neg Hx   . Liver disease Neg Hx     Social History   Social History  . Marital Status: Married    Spouse Name: N/A  . Number of Children: N/A  . Years of Education: N/A   Occupational History  . Not on file.   Social History Main Topics  . Smoking status: Current Every Day Smoker -- 0.50 packs/day    Types: Cigarettes  .  Smokeless tobacco: Not on file  . Alcohol Use: 0.0 oz/week    0 Standard drinks or equivalent per week     Comment: occ, 1-2 12 ounce beers about once a week (04/23/16)  . Drug Use: No  . Sexual Activity: Not on file   Other Topics Concern  . Not on file   Social History Narrative     ROS:  General: Negative for anorexia, weight loss, fever, chills, fatigue, weakness. Eyes: Negative for vision changes.  ENT: Negative for hoarseness, difficulty swallowing , nasal congestion. CV: Negative for chest pain, angina, palpitations, dyspnea on exertion, peripheral edema.  Respiratory: Negative for dyspnea at rest, dyspnea on exertion, cough, sputum, wheezing.  GI: See history of present illness. GU:  Negative for dysuria, hematuria, urinary incontinence, urinary frequency, nocturnal urination.  MS: Negative for joint pain, low back pain.  Derm: Negative for rash or itching.  Neuro: Negative for weakness, abnormal sensation, seizure, frequent headaches, memory loss, confusion.  Psych: Negative for anxiety, depression, suicidal ideation, hallucinations.  Endo: Negative for unusual weight change.  Heme: Negative for bruising or bleeding. Allergy: Negative for rash or hives.       Physical Examination: Vital signs in last 24 hours: Temp:  [98.3 F (36.8 C)-98.6 F (37 C)] 98.3 F (36.8 C) (07/07 0554) Pulse Rate:  [86-101] 87 (07/07 0554) Resp:  [15-24] 20 (07/07 0554) BP: (124-149)/(78-97) 133/82 mmHg (07/07 0554) SpO2:  [92 %-98 %] 97 % (07/07 0554) Weight:  [320 lb (145.151 kg)-323 lb 8 oz (146.739 kg)] 323 lb 8 oz (146.739 kg) (07/07 0554) Last BM Date: 04/21/16  General: Well-nourished, well-developed in no acute distress.  Head: Normocephalic, atraumatic.   Eyes: Conjunctiva pink, no icterus. Mouth: Oropharyngeal mucosa moist and pink , no lesions erythema or exudate. Neck: Supple without thyromegaly, masses, or lymphadenopathy.  Lungs: Clear to auscultation bilaterally.   Heart: Regular rate and rhythm, no murmurs rubs or gallops.  Abdomen: Bowel sounds are normal, mild to moderate epig tenderness, nondistended, no hepatosplenomegaly or masses, no abdominal bruits or    hernia , no rebound or guarding.   Rectal: not performed Extremities: No lower extremity edema, clubbing, deformity.  Neuro: Alert and oriented x 4 , grossly normal neurologically.  Skin: Warm and dry, no rash or jaundice.   Psych: Alert and cooperative, normal mood and affect.        Intake/Output from previous day: 07/06 0701 - 07/07 0700 In: 748.3 [I.V.:748.3] Out: -  Intake/Output this shift: Total I/O In: -  Out: 300 [Urine:300]  Lab Results: CBC  Recent Labs  04/22/16 1630 04/23/16 0530  WBC 13.9* 10.8*  HGB 14.6 12.9*  HCT 44.3 40.4  MCV 84.5 85.4  PLT 274 262   BMET  Recent Labs  04/22/16 1630 04/23/16 0530  NA 138 141  K 3.4* 3.2*  CL 103 107  CO2 27 28  GLUCOSE 105* 94  BUN 13 12  CREATININE 0.77 0.77  CALCIUM 9.5 8.5*   LFT  Recent Labs  04/22/16 1630 04/23/16 0530  BILITOT 4.1* 4.8*  ALKPHOS 169* 148*  AST 451* 239*  ALT 578* 441*  PROT 8.0 6.9  ALBUMIN 3.9 3.3*    Lipase  Recent Labs  04/22/16 1630 04/23/16 0530  LIPASE 704* 168*    PT/INR No results for input(s): LABPROT, INR in the last 72 hours.    Imaging Studies: Dg Abd Acute W/chest  04/22/2016  CLINICAL DATA:  Abdominal pain with gallstones. EXAM: DG ABDOMEN ACUTE W/ 1V CHEST COMPARISON:  Chest radiograph 05/08/2013. Abdominal ultrasound 05/08/2013. FINDINGS: Frontal view of the chest demonstrates midline trachea. Borderline cardiomegaly. Mediastinal contours otherwise within normal limits. No pleural effusion or pneumothorax. Mild lower lobe predominant interstitial thickening. Probable calcified granuloma at the right lung base. No lobar consolidation. Abdominal films demonstrate no free intraperitoneal air or significant air-fluid levels on upright positioning. Contrast  filled urinary bladder. No gaseous distention of bowel loops on supine imaging. Moderate amount of colonic stool. IMPRESSION: No acute findings. Chronic pulmonary interstitial thickening, likely related to the clinical history of smoking. Contrast filled bladder. Electronically Signed   By: Jeronimo Greaves M.D.   On: 04/22/2016 18:11  [4 week]   Impression: 50 year old gentleman presenting with suspected biliary pancreatitis. History of gallstones, current imaging (abdominal ultrasound and CT abdomen pelvis yesterday) without evidence of common bile duct obstruction at this time. Patient may have passed a stone but would like to see more significant drop in LFT parameters to go along with that. May require MRCP however at this time MRI is down, patient may be too large for our MRI machine [facility]. I am waiting on a call back from MRI tech. Clinically he is stable, abdominal pain somewhat improved. We'll continue aggressive hydration for pancreatitis, supportive measures over the next 24 hours, first 48 hours his critical after onset of disease.   Plan: 1. Patient is now greater than 24 hours out from initial presentation. Would recommend continued aggressive hydration today although BUN/creatinine, H&H reassuring. Limited documented urine output last shift, urine dark in appearance but in part due to elevated bilirubin. Continue to monitor closely. Per guidelines he can receive 5 to 10 mL/kg per hour of NS given he has no history of cardiovascular, renal disease. Aggressive hydration recommended to decrease risk of acute tubular necrosis and necrotizing pancreatitis. 2. Continue NPO. 3. I have spoke with Kathlene November, MRI Tech. MRI machine will be down at least until tomorrow. Based on patient's weight, he likely would require large-bore MRI machine at home. 4. Discussed with Dr. Jena Gauss, hold off on MRI for now. We will recheck his LFTs in the morning, hopefully we will see further decline indicating a  passed stone. At this time there is no indication for ERCP without documented evidence of common bile duct dilation, visualized common bile duct stone, cholangitis. 5. Patient will need cholecystectomy once pancreatitis has resolved.  We would like to thank you for the opportunity to participate in the care of Timothy Dalton.  Leanna Battles. [facility] [phone] 7/7/20179:57 AM    LOS: 1 day    Addendum: Not addressed above. Patient has significant reflux/heartburn, intermittent rectal bleeding. We'll plan on bringing him back in the office for consideration of colonoscopy plus or minus EGD as appropriate. Start PPI therapy now.  Leanna Battles. [facility] [phone] 7/7/201710:19 AM

## 2016-04-23 NOTE — Telephone Encounter (Signed)
Please schedule hospital follow-up office visit in 8 weeks with Dr. Jena Gaussourk or myself for GERD, rectal bleeding, biliary pancreatitis.

## 2016-04-23 NOTE — Progress Notes (Addendum)
[facility] PROGRESS NOTE  Timothy Dalton ZOX:[id number] DOB: [birth year] DOA: 04/22/2016   PCP: Pershing Proud     Assessment/Plan: Principal Problem:   Pancreatitis, acute Active Problems:   Abdominal pain, epigastric   Gallstones   Abnormal LFTs   Acute pancreatitis   Gallstone pancreatitis  Timothy Dalton is a 50 y.o. male with no PMH presented to outside ED this am with abd pain and was found to have pancreatitis and gallstones by CT scan. He left there and drove home to [place] and came to the ED here. We are asked to see for admission.In ED tonight AST and ALT are up 400- 600 range, lipase is ~700, Tbili 4.1 and WBC 14k.US shows acoustic shadowing GB fossa concerning for small gallstones vs air-filled loop of bowel versus porcelain gallbladder versus emphysematous cholecystitis. Follow-up CT scan showed minimal stranding adjacent to the pancreas. No common bile duct dilation    Assessment and plan Acute pancreatitis-likely secondary to gallstones plus minus alcohol use, last drink 2 weeks ago GI consultant-they recommend MRCP Continue NPO Aggressive IV fluids Monitor liver function, Continue Zosyn due to elevated white count, cannot rule out cholangitis Check triglycerides  Morbid obesity Body mass index is 42.69 kg/(m^2).   DVT prophylaxsis  heparin  Code Status:  Full code    Family Communication: Discussed in detail with the patient, all imaging results, lab results explained to the patient   Disposition Plan:  As per GI recommendations      Consultants:   Gastroenterology    Procedures:   None    Antibiotics: Anti-infectives    Start     Dose/Rate Route Frequency Ordered Stop   04/23/16 0130  piperacillin-tazobactam (ZOSYN) IVPB 3.375 g     3.375 g 12.5 mL/hr over 240 Minutes Intravenous Every 8 hours 04/22/16 2318     04/23/16 0100  piperacillin-tazobactam (ZOSYN) IVPB 3.375 g  Status:  Discontinued     3.375 g 12.5  mL/hr over 240 Minutes Intravenous Every 6 hours 04/22/16 2303 04/22/16 2317   04/22/16 1730  piperacillin-tazobactam (ZOSYN) IVPB 3.375 g     3.375 g 12.5 mL/hr over 240 Minutes Intravenous  Once 04/22/16 1725 04/22/16 1827         HPI/Subjective: Still complaining of epigastric and right upper quadrant pain  Objective: Filed Vitals:   04/22/16 1822 04/22/16 2000 04/22/16 2112 04/23/16 0554  BP: 124/78 132/83 149/87 133/82  Pulse: 86 90 92 87  Temp:   98.6 F (37 C) 98.3 F (36.8 C)  TempSrc:   Oral Oral  Resp: Height:    (1.854 m)   Weight:   146.285 kg (322 lb 8 oz) 146.739 kg (323 lb 8 oz)  SpO2: 98% 92% 95% 97%    Intake/Output Summary (Last 24 hours) at 04/23/16 0916 Last data filed at 04/23/16 0856  Gross per 24 hour  Intake 748.33 ml  Output    300 ml  Net 448.33 ml    Exam:  Examination:  General exam: Appears calm and comfortable  Respiratory system: Clear to auscultation. Respiratory effort normal. Cardiovascular system: S1 & S2 heard, RRR. No JVD, murmurs, rubs, gallops or clicks. No pedal edema. Gastrointestinal system: Abdomen is nondistended, soft and , tender right upper quadrant No organomegaly or masses felt. Normal bowel sounds heard. Central nervous system: Alert and oriented. No focal neurological deficits. Extremities: Symmetric 5 x 5 power. Skin: No rashes, lesions or ulcers  Psychiatry: Judgement and insight appear normal. Mood & affect appropriate.     Data Reviewed: I have personally reviewed following labs and imaging studies  Micro Results No results found for this or any previous visit (from the past 240 hour(s)).  Radiology Reports Dg Abd Acute W/chest  04/22/2016  CLINICAL DATA:  Abdominal pain with gallstones. EXAM: DG ABDOMEN ACUTE W/ 1V CHEST COMPARISON:  Chest radiograph 05/08/2013. Abdominal ultrasound 05/08/2013. FINDINGS: Frontal view of the chest demonstrates midline trachea. Borderline cardiomegaly.  Mediastinal contours otherwise within normal limits. No pleural effusion or pneumothorax. Mild lower lobe predominant interstitial thickening. Probable calcified granuloma at the right lung base. No lobar consolidation. Abdominal films demonstrate no free intraperitoneal air or significant air-fluid levels on upright positioning. Contrast filled urinary bladder. No gaseous distention of bowel loops on supine imaging. Moderate amount of colonic stool. IMPRESSION: No acute findings. Chronic pulmonary interstitial thickening, likely related to the clinical history of smoking. Contrast filled bladder. Electronically Signed   By: Jeronimo GreavesKyle  Talbot M.D.   On: 04/22/2016 18:11     CBC  Recent Labs Lab 04/22/16 1630 04/23/16 0530  WBC 13.9* 10.8*  HGB 14.6 12.9*  HCT 44.3 40.4  PLT 274 262  MCV 84.5 85.4  MCH 27.9 27.3  MCHC 33.0 31.9  RDW 15.1 15.2  LYMPHSABS 1.3  --   MONOABS 0.6  --   EOSABS 0.1  --   BASOSABS 0.0  --     Chemistries   Recent Labs Lab 04/22/16 1630 04/23/16 0530  NA 138 141  K 3.4* 3.2*  CL 103 107  CO2 27 28  GLUCOSE 105* 94  BUN 13 12  CREATININE 0.77 0.77  CALCIUM 9.5 8.5*  AST 451* 239*  ALT 578* 441*  ALKPHOS 169* 148*  BILITOT 4.1* 4.8*   ------------------------------------------------------------------------------------------------------------------ estimated creatinine clearance is 172.1 mL/min (by C-G formula based on Cr of 0.77). ------------------------------------------------------------------------------------------------------------------ No results for input(s): HGBA1C in the last 72 hours. ------------------------------------------------------------------------------------------------------------------ No results for input(s): CHOL, HDL, LDLCALC, TRIG, CHOLHDL, LDLDIRECT in the last 72 hours. ------------------------------------------------------------------------------------------------------------------ No results for input(s): TSH, T4TOTAL,  T3FREE, THYROIDAB in the last 72 hours.  Invalid input(s): FREET3 ------------------------------------------------------------------------------------------------------------------ No results for input(s): VITAMINB12, FOLATE, FERRITIN, TIBC, IRON, RETICCTPCT in the last 72 hours.  Coagulation profile No results for input(s): INR, PROTIME in the last 168 hours.  No results for input(s): DDIMER in the last 72 hours.  Cardiac Enzymes No results for input(s): CKMB, TROPONINI, MYOGLOBIN in the last 168 hours.  Invalid input(s): CK ------------------------------------------------------------------------------------------------------------------ Invalid input(s): POCBNP   CBG: No results for input(s): GLUCAP in the last 168 hours.     Studies: Dg Abd Acute W/chest  04/22/2016  CLINICAL DATA:  Abdominal pain with gallstones. EXAM: DG ABDOMEN ACUTE W/ 1V CHEST COMPARISON:  Chest radiograph 05/08/2013. Abdominal ultrasound 05/08/2013. FINDINGS: Frontal view of the chest demonstrates midline trachea. Borderline cardiomegaly. Mediastinal contours otherwise within normal limits. No pleural effusion or pneumothorax. Mild lower lobe predominant interstitial thickening. Probable calcified granuloma at the right lung base. No lobar consolidation. Abdominal films demonstrate no free intraperitoneal air or significant air-fluid levels on upright positioning. Contrast filled urinary bladder. No gaseous distention of bowel loops on supine imaging. Moderate amount of colonic stool. IMPRESSION: No acute findings. Chronic pulmonary interstitial thickening, likely related to the clinical history of smoking. Contrast filled bladder. Electronically Signed   By: Jeronimo GreavesKyle  Talbot M.D.   On: 04/22/2016 18:11      No results found for: HGBA1C Lab Results  Component Value Date   CREATININE 0.77 04/23/2016       Scheduled Meds: . heparin  5,000 Units Subcutaneous Q8H  . piperacillin-tazobactam (ZOSYN)  IV  3.375  g Intravenous Q8H   Continuous Infusions: . 0.9 % NaCl with KCl 40 mEq / L 125 mL/hr (04/23/16 0851)     LOS: 1 day    Time spent: >30 MINS    [facility],Kanai Hilger  [facility] Pager [phone]. If 7PM-7AM, please contact night-coverage at [email or web address], password [email or web address] 04/23/2016, 9:16 AM  LOS: 1 day

## 2016-04-24 DIAGNOSIS — K851 Biliary acute pancreatitis without necrosis or infection: Secondary | ICD-10-CM

## 2016-04-24 DIAGNOSIS — K8511 Biliary acute pancreatitis with uninfected necrosis: Secondary | ICD-10-CM

## 2016-04-24 DIAGNOSIS — K852 Alcohol induced acute pancreatitis without necrosis or infection: Secondary | ICD-10-CM

## 2016-04-24 DIAGNOSIS — R7989 Other specified abnormal findings of blood chemistry: Secondary | ICD-10-CM

## 2016-04-24 LAB — COMPREHENSIVE METABOLIC PANEL
ALK PHOS: 135 U/L — AB (ref 38–126)
ALT: 261 U/L — ABNORMAL HIGH (ref 17–63)
ANION GAP: 5 (ref 5–15)
AST: 79 U/L — ABNORMAL HIGH (ref 15–41)
Albumin: 3.2 g/dL — ABNORMAL LOW (ref 3.5–5.0)
BILIRUBIN TOTAL: 1.5 mg/dL — AB (ref 0.3–1.2)
BUN: 9 mg/dL (ref 6–20)
CALCIUM: 8.5 mg/dL — AB (ref 8.9–10.3)
CO2: 26 mmol/L (ref 22–32)
CREATININE: 0.74 mg/dL (ref 0.61–1.24)
Chloride: 106 mmol/L (ref 101–111)
GFR calc non Af Amer: >60 mL/min (ref >60)
Glucose, Bld: 81 mg/dL (ref 65–99)
Potassium: 3.5 mmol/L (ref 3.5–5.1)
Sodium: 137 mmol/L (ref 135–145)
TOTAL PROTEIN: 7 g/dL (ref 6.5–8.1)

## 2016-04-24 LAB — CBC WITH DIFFERENTIAL/PLATELET
BASOS ABS: 0 10*3/uL (ref 0.0–0.1)
Basophils Relative: 0 %
EOS PCT: 2 %
Eosinophils Absolute: 0.2 10*3/uL (ref 0.0–0.7)
HEMATOCRIT: 39.3 % (ref 39.0–52.0)
HEMOGLOBIN: 12.5 g/dL — AB (ref 13.0–17.0)
LYMPHS ABS: 2.5 10*3/uL (ref 0.7–4.0)
LYMPHS PCT: 22 %
MCH: 27.4 pg (ref 26.0–34.0)
MCHC: 31.8 g/dL (ref 30.0–36.0)
MCV: 86.2 fL (ref 78.0–100.0)
Monocytes Absolute: 0.8 10*3/uL (ref 0.1–1.0)
Monocytes Relative: 7 %
NEUTROS ABS: 8 10*3/uL — AB (ref 1.7–7.7)
NEUTROS PCT: 69 %
PLATELETS: 250 10*3/uL (ref 150–400)
RBC: 4.56 MIL/uL (ref 4.22–5.81)
RDW: 15.2 % (ref 11.5–15.5)
WBC: 11.4 10*3/uL — AB (ref 4.0–10.5)

## 2016-04-24 NOTE — Discharge Summary (Signed)
Physician Discharge Summary  Timothy Dalton MRN: [id number] DOB/AGE: 11-05-1965 50 y.o.  PCP: Jana Half   Admit date: 04/22/2016 Discharge date: 04/24/2016  Discharge Diagnoses:     Principal Problem:   Pancreatitis, acute Active Problems:   Abdominal pain, epigastric   Gallstones   Abnormal LFTs   Acute pancreatitis   Gallstone pancreatitis    Follow-up recommendations Follow-up with PCP in 3-5 days , including all  additional recommended appointments as below Follow-up CBC, CMP in 3-5 days follow-up outpatient this week for semi-elective cholecystectomy      There are no discharge medications for this patient.    Discharge Condition: stable    Discharge Instructions    Diet - low sodium heart healthy    Complete by:  As directed      Increase activity slowly    Complete by:  As directed             No Known Allergies    Disposition: 01-Home or Self Care   Consults:  GI surgery    Significant Diagnostic Studies:  Dg Abd Acute W/chest  04/22/2016  CLINICAL DATA:  Abdominal pain with gallstones. EXAM: DG ABDOMEN ACUTE W/ 1V CHEST COMPARISON:  Chest radiograph 05/08/2013. Abdominal ultrasound 05/08/2013. FINDINGS: Frontal view of the chest demonstrates midline trachea. Borderline cardiomegaly. Mediastinal contours otherwise within normal limits. No pleural effusion or pneumothorax. Mild lower lobe predominant interstitial thickening. Probable calcified granuloma at the right lung base. No lobar consolidation. Abdominal films demonstrate no free intraperitoneal air or significant air-fluid levels on upright positioning. Contrast filled urinary bladder. No gaseous distention of bowel loops on supine imaging. Moderate amount of colonic stool. IMPRESSION: No acute findings. Chronic pulmonary interstitial thickening, likely related to the clinical history of smoking. Contrast filled bladder. Electronically Signed   By: Abigail Miyamoto M.D.   On: 04/22/2016  18:11        Filed Weights   04/22/16 2112 04/23/16 0554 04/24/16 0600  Weight: 146.285 kg (322 lb 8 oz) 146.739 kg (323 lb 8 oz) 148.553 kg (327 lb 8 oz)     Microbiology: No results found for this or any previous visit (from the past 240 hour(s)).     Blood Culture No results found for: SDES, SPECREQUEST, CULT, REPTSTATUS    Labs: Results for orders placed or performed during the hospital encounter of 04/22/16 (from the past 48 hour(s))  Comprehensive metabolic panel     Status: Abnormal   Collection Time: 04/22/16  4:30 PM  Result Value Ref Range   Sodium 138 135 - 145 mmol/L   Potassium 3.4 (L) 3.5 - 5.1 mmol/L   Chloride 103 101 - 111 mmol/L   CO2 27 22 - 32 mmol/L   Glucose, Bld 105 (H) 65 - 99 mg/dL   BUN 13 6 - 20 mg/dL   Creatinine, Ser 0.77 0.61 - 1.24 mg/dL   Calcium 9.5 8.9 - 10.3 mg/dL   Total Protein 8.0 6.5 - 8.1 g/dL   Albumin 3.9 3.5 - 5.0 g/dL   AST 451 (H) 15 - 41 U/L   ALT 578 (H) 17 - 63 U/L   Alkaline Phosphatase 169 (H) 38 - 126 U/L   Total Bilirubin 4.1 (H) 0.3 - 1.2 mg/dL   GFR calc non Af Amer >60 >60 mL/min   GFR calc Af Amer >60 >60 mL/min    Comment: (NOTE) The eGFR has been calculated using the CKD EPI equation. This calculation has not been validated in all clinical  situations. eGFR's persistently <60 mL/min signify possible Chronic Kidney Disease.    Anion gap 8 5 - 15  Lipase, blood     Status: Abnormal   Collection Time: 04/22/16  4:30 PM  Result Value Ref Range   Lipase 704 (H) 11 - 51 U/L    Comment: RESULTS CONFIRMED BY MANUAL DILUTION  CBC with Differential/Platelet     Status: Abnormal   Collection Time: 04/22/16  4:30 PM  Result Value Ref Range   WBC 13.9 (H) 4.0 - 10.5 K/uL   RBC 5.24 4.22 - 5.81 MIL/uL   Hemoglobin 14.6 13.0 - 17.0 g/dL   HCT 44.3 39.0 - 52.0 %   MCV 84.5 78.0 - 100.0 fL   MCH 27.9 26.0 - 34.0 pg   MCHC 33.0 30.0 - 36.0 g/dL   RDW 15.1 11.5 - 15.5 %   Platelets 274 150 - 400 K/uL    Neutrophils Relative % 87 %   Neutro Abs 12.0 (H) 1.7 - 7.7 K/uL   Lymphocytes Relative 9 %   Lymphs Abs 1.3 0.7 - 4.0 K/uL   Monocytes Relative 4 %   Monocytes Absolute 0.6 0.1 - 1.0 K/uL   Eosinophils Relative 0 %   Eosinophils Absolute 0.1 0.0 - 0.7 K/uL   Basophils Relative 0 %   Basophils Absolute 0.0 0.0 - 0.1 K/uL  I-Stat CG4 Lactic Acid, ED     Status: None   Collection Time: 04/22/16  4:48 PM  Result Value Ref Range   Lactic Acid, Venous 1.23 0.5 - 1.9 mmol/L  Urinalysis, Routine w reflex microscopic (not at [facility])     Status: Abnormal   Collection Time: 04/22/16  7:20 PM  Result Value Ref Range   Color, Urine YELLOW YELLOW   APPearance CLEAR CLEAR   Specific Gravity, Urine 1.025 1.005 - 1.030   pH 5.5 5.0 - 8.0   Glucose, UA NEGATIVE NEGATIVE mg/dL   Hgb urine dipstick SMALL (A) NEGATIVE   Bilirubin Urine MODERATE (A) NEGATIVE   Ketones, ur NEGATIVE NEGATIVE mg/dL   Protein, ur TRACE (A) NEGATIVE mg/dL   Nitrite NEGATIVE NEGATIVE   Leukocytes, UA NEGATIVE NEGATIVE  Urine microscopic-add on     Status: Abnormal   Collection Time: 04/22/16  7:20 PM  Result Value Ref Range   Squamous Epithelial / LPF 0-5 (A) NONE SEEN   WBC, UA 0-5 0 - 5 WBC/hpf   RBC / HPF 0-5 0 - 5 RBC/hpf   Bacteria, UA MANY (A) NONE SEEN   Urine-Other AMORPHOUS URATES/PHOSPHATES   I-Stat CG4 Lactic Acid, ED     Status: None   Collection Time: 04/22/16  8:41 PM  Result Value Ref Range   Lactic Acid, Venous 0.80 0.5 - 1.9 mmol/L  CBC     Status: Abnormal   Collection Time: 04/23/16  5:30 AM  Result Value Ref Range   WBC 10.8 (H) 4.0 - 10.5 K/uL   RBC 4.73 4.22 - 5.81 MIL/uL   Hemoglobin 12.9 (L) 13.0 - 17.0 g/dL   HCT 40.4 39.0 - 52.0 %   MCV 85.4 78.0 - 100.0 fL   MCH 27.3 26.0 - 34.0 pg   MCHC 31.9 30.0 - 36.0 g/dL   RDW 15.2 11.5 - 15.5 %   Platelets 262 150 - 400 K/uL  Comprehensive metabolic panel     Status: Abnormal   Collection Time: 04/23/16  5:30 AM  Result Value Ref Range    Sodium 141 135 - 145 mmol/L  Potassium 3.2 (L) 3.5 - 5.1 mmol/L   Chloride 107 101 - 111 mmol/L   CO2 28 22 - 32 mmol/L   Glucose, Bld 94 65 - 99 mg/dL   BUN 12 6 - 20 mg/dL   Creatinine, Ser 0.77 0.61 - 1.24 mg/dL   Calcium 8.5 (L) 8.9 - 10.3 mg/dL   Total Protein 6.9 6.5 - 8.1 g/dL   Albumin 3.3 (L) 3.5 - 5.0 g/dL   AST 239 (H) 15 - 41 U/L   ALT 441 (H) 17 - 63 U/L   Alkaline Phosphatase 148 (H) 38 - 126 U/L   Total Bilirubin 4.8 (H) 0.3 - 1.2 mg/dL   GFR calc non Af Amer >60 >60 mL/min   GFR calc Af Amer >60 >60 mL/min    Comment: (NOTE) The eGFR has been calculated using the CKD EPI equation. This calculation has not been validated in all clinical situations. eGFR's persistently <60 mL/min signify possible Chronic Kidney Disease.    Anion gap 6 5 - 15  Lipase, blood     Status: Abnormal   Collection Time: 04/23/16  5:30 AM  Result Value Ref Range   Lipase 168 (H) 11 - 51 U/L  Lipid panel     Status: Abnormal   Collection Time: 04/23/16 10:36 AM  Result Value Ref Range   Cholesterol 193 0 - 200 mg/dL   Triglycerides 263 (H) <150 mg/dL   HDL 28 (L) >40 mg/dL   Total CHOL/HDL Ratio 6.9 RATIO   VLDL 53 (H) 0 - 40 mg/dL   LDL Cholesterol 112 (H) 0 - 99 mg/dL    Comment:        Total Cholesterol/HDL:CHD Risk Coronary Heart Disease Risk Table                     Men   Women  1/2 Average Risk   3.4   3.3  Average Risk       5.0   4.4  2 X Average Risk   9.6   7.1  3 X Average Risk  23.4   11.0        Use the calculated Patient Ratio above and the CHD Risk Table to determine the patient's CHD Risk.        ATP III CLASSIFICATION (LDL):  <100     mg/dL   Optimal  100-129  mg/dL   Near or Above                    Optimal  130-159  mg/dL   Borderline  160-189  mg/dL   High  >190     mg/dL   Very High   Urine rapid drug screen (hosp performed)     Status: Abnormal   Collection Time: 04/23/16  4:06 PM  Result Value Ref Range   Opiates POSITIVE (A) NONE DETECTED    Cocaine NONE DETECTED NONE DETECTED   Benzodiazepines NONE DETECTED NONE DETECTED   Amphetamines NONE DETECTED NONE DETECTED   Tetrahydrocannabinol NONE DETECTED NONE DETECTED   Barbiturates NONE DETECTED NONE DETECTED    Comment:        DRUG SCREEN FOR MEDICAL PURPOSES ONLY.  IF CONFIRMATION IS NEEDED FOR ANY PURPOSE, NOTIFY LAB WITHIN 5 DAYS.        LOWEST DETECTABLE LIMITS FOR URINE DRUG SCREEN Drug Class       Cutoff (ng/mL) Amphetamine      1000 Barbiturate      200  Benzodiazepine   440 Tricyclics       347 Opiates          300 Cocaine          300 THC              50   Comprehensive metabolic panel     Status: Abnormal   Collection Time: 04/24/16  6:41 AM  Result Value Ref Range   Sodium 137 135 - 145 mmol/L   Potassium 3.5 3.5 - 5.1 mmol/L   Chloride 106 101 - 111 mmol/L   CO2 26 22 - 32 mmol/L   Glucose, Bld 81 65 - 99 mg/dL   BUN 9 6 - 20 mg/dL   Creatinine, Ser 0.74 0.61 - 1.24 mg/dL   Calcium 8.5 (L) 8.9 - 10.3 mg/dL   Total Protein 7.0 6.5 - 8.1 g/dL   Albumin 3.2 (L) 3.5 - 5.0 g/dL   AST 79 (H) 15 - 41 U/L   ALT 261 (H) 17 - 63 U/L   Alkaline Phosphatase 135 (H) 38 - 126 U/L   Total Bilirubin 1.5 (H) 0.3 - 1.2 mg/dL   GFR calc non Af Amer >60 >60 mL/min   GFR calc Af Amer >60 >60 mL/min    Comment: (NOTE) The eGFR has been calculated using the CKD EPI equation. This calculation has not been validated in all clinical situations. eGFR's persistently <60 mL/min signify possible Chronic Kidney Disease.    Anion gap 5 5 - 15  CBC with Differential/Platelet     Status: Abnormal   Collection Time: 04/24/16  6:41 AM  Result Value Ref Range   WBC 11.4 (H) 4.0 - 10.5 K/uL   RBC 4.56 4.22 - 5.81 MIL/uL   Hemoglobin 12.5 (L) 13.0 - 17.0 g/dL   HCT 39.3 39.0 - 52.0 %   MCV 86.2 78.0 - 100.0 fL   MCH 27.4 26.0 - 34.0 pg   MCHC 31.8 30.0 - 36.0 g/dL   RDW 15.2 11.5 - 15.5 %   Platelets 250 150 - 400 K/uL   Neutrophils Relative % 69 %   Neutro Abs 8.0 (H) 1.7  - 7.7 K/uL   Lymphocytes Relative 22 %   Lymphs Abs 2.5 0.7 - 4.0 K/uL   Monocytes Relative 7 %   Monocytes Absolute 0.8 0.1 - 1.0 K/uL   Eosinophils Relative 2 %   Eosinophils Absolute 0.2 0.0 - 0.7 K/uL   Basophils Relative 0 %   Basophils Absolute 0.0 0.0 - 0.1 K/uL     Lipid Panel     Component Value Date/Time   CHOL 193 04/23/2016 1036   TRIG 263* 04/23/2016 1036   HDL 28* 04/23/2016 1036   CHOLHDL 6.9 04/23/2016 1036   VLDL 53* 04/23/2016 1036   LDLCALC 112* 04/23/2016 1036     No results found for: HGBA1C   Lab Results  Component Value Date   LDLCALC 112* 04/23/2016   CREATININE 0.74 04/24/2016    Timothy Dalton is a 50 y.o. male with no PMH presented to outside ED this am with abd pain and was found to have pancreatitis and gallstones by CT scan. He left there and drove home to [place] and came to the ED here. We are asked to see for admission.In ED tonight AST and ALT are up 400- 600 range, lipase is ~700, Tbili 4.1 and WBC 14k.US shows acoustic shadowing GB fossa concerning for small gallstones vs air-filled loop of bowel versus porcelain gallbladder versus  emphysematous cholecystitis. Follow-up CT scan showed minimal stranding adjacent to the pancreas. No common bile duct dilation    Hospital course  Acute BILIARY pancreatitis-likely secondary to gallstones plus minus alcohol use, last drink 2 weeks ago GI consultant-they recommend MRCP, UNABLE due to weight ,  Significant drop in transaminases and bilirubin implying on 10 years passage of common duct stone. Given normal-sized bile duct no indication for ERCP. This is second episode of biliary pancreatitis. Patient needs to get his gallbladder out before he has another episode. Appreciate Dr. Rosana Hoes seeing the patient for cholecystectomy to be done on an outpatient basis. Aggressive IV fluids  Triglycerides ok Transaminases And bilirubin improved .He will have LFTs repeated prior to cholecystectomy it is  recommended that he get his gallbladder out within the next 2 weeks.     Morbid obesity Body mass index is 43.22 kg/(m^2).   Discharge Exam:    Blood pressure 134/82, pulse 90, temperature 99 F (37.2 C), temperature source Oral, resp. rate 20, height 6' 1" (1.854 m), weight 148.553 kg (327 lb 8 oz), SpO2 100 %. Gen alert, obese AAM, no distress No rash, cyanosis or gangrene Sclera anicteric, throat clear  No jvd or bruits Chest clear bilat RRR no MRG Abd obese, diffuse mild/mod tenderness, dec'd BS , no ascites GU normal male MS no joint effusions or deformity Ext no LE edema / no wounds or ulcers Neuro is alert, Ox 3 , nf     Follow-up Information    Follow up with Vickie Epley, MD. Schedule an appointment as soon as possible for a visit in 3 days.   Specialty:  General Surgery   Why:  pls call on monday to verify appt    Contact information:   [address] [place] [facility] [address] [phone]       Follow up with Delman Cheadle, PA-C. Schedule an appointment as soon as possible for a visit in 3 days.   Specialty:  Family Medicine   Contact information:   [address] [place] [address] [phone]       Signed: Reyne Dumas 04/24/2016, 4:03 PM        Time spent >45 mins  \

## 2016-04-24 NOTE — Progress Notes (Signed)
Pt discharged home with belongings, IV removed and intact.  Pt to call and schedule surgery on Monday morning.

## 2016-04-24 NOTE — Progress Notes (Signed)
[facility] PROGRESS NOTE  Timothy Dalton:[id number] DOB: [birth year] DOA: 04/22/2016   PCP: Pershing Proud     Assessment/Plan: Principal Problem:   Pancreatitis, acute Active Problems:   Abdominal pain, epigastric   Gallstones   Abnormal LFTs   Acute pancreatitis   Gallstone pancreatitis    Timothy Dalton is a 50 y.o. male with no PMH presented to outside ED this am with abd pain and was found to have pancreatitis and gallstones by CT scan. He left there and drove home to [place] and came to the ED here. We are asked to see for admission.In ED tonight AST and ALT are up 400- 600 range, lipase is ~700, Tbili 4.1 and WBC 14k.US shows acoustic shadowing GB fossa concerning for small gallstones vs air-filled loop of bowel versus porcelain gallbladder versus emphysematous cholecystitis. Follow-up CT scan showed minimal stranding adjacent to the pancreas. No common bile duct dilation    Assessment and plan Acute BILIARY pancreatitis-likely secondary to gallstones plus minus alcohol use, last drink 2 weeks ago GI consultant-they recommend MRCP, UNABLE due to weight ,GI to decide about an ERCP Continue NPO pending further decisions from Dr. Minus Liberty Aggressive IV fluids Monitor liver function, Continue Zosyn due to elevated white count, cannot rule out cholangitis Triglycerides ok Transaminases  And bilirubin improved  it is recommended that he get his gallbladder out within the next 2 weeks. I have notified Dr. Onalee Hua   Morbid obesity Body mass index is 43.22 kg/(m^2).   DVT prophylaxsis  heparin  Code Status:  Full code    Family Communication: Discussed in detail with the patient, all imaging results, lab results explained to the patient   Disposition Plan:  As per GI recommendations      Consultants:   Gastroenterology    Procedures:   None    Antibiotics: Anti-infectives    Start     Dose/Rate Route Frequency Ordered Stop   04/23/16 0130  piperacillin-tazobactam (ZOSYN) IVPB 3.375 g     3.375 g 12.5 mL/hr over 240 Minutes Intravenous Every 8 hours 04/22/16 2318     04/23/16 0100  piperacillin-tazobactam (ZOSYN) IVPB 3.375 g  Status:  Discontinued     3.375 g 12.5 mL/hr over 240 Minutes Intravenous Every 6 hours 04/22/16 2303 04/22/16 2317   04/22/16 1730  piperacillin-tazobactam (ZOSYN) IVPB 3.375 g     3.375 g 12.5 mL/hr over 240 Minutes Intravenous  Once 04/22/16 1725 04/22/16 1827         HPI/Subjective: Still complaining of epigastric and right upper quadrant pain  Objective: Filed Vitals:   04/23/16 0554 04/23/16 1500 04/24/16 0556 04/24/16 0600  BP: 133/82 133/84 130/67   Pulse: 87 84 83   Temp: 98.3 F (36.8 C) 98.8 F (37.1 C) 98.1 F (36.7 C)   TempSrc: Oral Oral Oral   Resp: 20 18 22    Height:      Weight: 146.739 kg (323 lb 8 oz)   148.553 kg (327 lb 8 oz)  SpO2: 97% 98% 97%     Intake/Output Summary (Last 24 hours) at 04/24/16 0856 Last data filed at 04/24/16 0700  Gross per 24 hour  Intake    240 ml  Output    800 ml  Net   -560 ml    Exam:  Examination:  General exam: Appears calm and comfortable  Respiratory system: Clear to auscultation. Respiratory effort normal. Cardiovascular system: S1 & S2 heard, RRR. No JVD, murmurs, rubs,  gallops or clicks. No pedal edema. Gastrointestinal system: Abdomen is nondistended, soft and , tender right upper quadrant No organomegaly or masses felt. Normal bowel sounds heard. Central nervous system: Alert and oriented. No focal neurological deficits. Extremities: Symmetric 5 x 5 power. Skin: No rashes, lesions or ulcers Psychiatry: Judgement and insight appear normal. Mood & affect appropriate.     Data Reviewed: I have personally reviewed following labs and imaging studies  Micro Results No results found for this or any previous visit (from the past 240 hour(s)).  Radiology Reports Dg Abd Acute W/chest  04/22/2016  CLINICAL  DATA:  Abdominal pain with gallstones. EXAM: DG ABDOMEN ACUTE W/ 1V CHEST COMPARISON:  Chest radiograph 05/08/2013. Abdominal ultrasound 05/08/2013. FINDINGS: Frontal view of the chest demonstrates midline trachea. Borderline cardiomegaly. Mediastinal contours otherwise within normal limits. No pleural effusion or pneumothorax. Mild lower lobe predominant interstitial thickening. Probable calcified granuloma at the right lung base. No lobar consolidation. Abdominal films demonstrate no free intraperitoneal air or significant air-fluid levels on upright positioning. Contrast filled urinary bladder. No gaseous distention of bowel loops on supine imaging. Moderate amount of colonic stool. IMPRESSION: No acute findings. Chronic pulmonary interstitial thickening, likely related to the clinical history of smoking. Contrast filled bladder. Electronically Signed   By: Jeronimo GreavesKyle  Talbot M.D.   On: 04/22/2016 18:11     CBC  Recent Labs Lab 04/22/16 1630 04/23/16 0530 04/24/16 0641  WBC 13.9* 10.8* 11.4*  HGB 14.6 12.9* 12.5*  HCT 44.3 40.4 39.3  PLT 274 262 250  MCV 84.5 85.4 86.2  MCH 27.9 27.3 27.4  MCHC 33.0 31.9 31.8  RDW 15.1 15.2 15.2  LYMPHSABS 1.3  --  2.5  MONOABS 0.6  --  0.8  EOSABS 0.1  --  0.2  BASOSABS 0.0  --  0.0    Chemistries   Recent Labs Lab 04/22/16 1630 04/23/16 0530 04/24/16 0641  NA 138 141 137  K 3.4* 3.2* 3.5  CL 103 107 106  CO2 27 28 26   GLUCOSE 105* 94 81  BUN 13 12 9   CREATININE 0.77 0.77 0.74  CALCIUM 9.5 8.5* 8.5*  AST 451* 239* 79*  ALT 578* 441* 261*  ALKPHOS 169* 148* 135*  BILITOT 4.1* 4.8* 1.5*   ------------------------------------------------------------------------------------------------------------------ estimated creatinine clearance is 173.4 mL/min (by C-G formula based on Cr of 0.74). ------------------------------------------------------------------------------------------------------------------ No results for input(s): HGBA1C in the last  72 hours. ------------------------------------------------------------------------------------------------------------------  Recent Labs  04/23/16 1036  CHOL 193  HDL 28*  LDLCALC 112*  TRIG 263*  CHOLHDL 6.9   ------------------------------------------------------------------------------------------------------------------ No results for input(s): TSH, T4TOTAL, T3FREE, THYROIDAB in the last 72 hours.  Invalid input(s): FREET3 ------------------------------------------------------------------------------------------------------------------ No results for input(s): VITAMINB12, FOLATE, FERRITIN, TIBC, IRON, RETICCTPCT in the last 72 hours.  Coagulation profile No results for input(s): INR, PROTIME in the last 168 hours.  No results for input(s): DDIMER in the last 72 hours.  Cardiac Enzymes No results for input(s): CKMB, TROPONINI, MYOGLOBIN in the last 168 hours.  Invalid input(s): CK ------------------------------------------------------------------------------------------------------------------ Invalid input(s): POCBNP   CBG: No results for input(s): GLUCAP in the last 168 hours.     Studies: Dg Abd Acute W/chest  04/22/2016  CLINICAL DATA:  Abdominal pain with gallstones. EXAM: DG ABDOMEN ACUTE W/ 1V CHEST COMPARISON:  Chest radiograph 05/08/2013. Abdominal ultrasound 05/08/2013. FINDINGS: Frontal view of the chest demonstrates midline trachea. Borderline cardiomegaly. Mediastinal contours otherwise within normal limits. No pleural effusion or pneumothorax. Mild lower lobe predominant interstitial thickening. Probable calcified granuloma at  the right lung base. No lobar consolidation. Abdominal films demonstrate no free intraperitoneal air or significant air-fluid levels on upright positioning. Contrast filled urinary bladder. No gaseous distention of bowel loops on supine imaging. Moderate amount of colonic stool. IMPRESSION: No acute findings. Chronic pulmonary  interstitial thickening, likely related to the clinical history of smoking. Contrast filled bladder. Electronically Signed   By: Jeronimo Greaves M.D.   On: 04/22/2016 18:11      No results found for: HGBA1C Lab Results  Component Value Date   LDLCALC 112* 04/23/2016   CREATININE 0.74 04/24/2016       Scheduled Meds: . heparin  5,000 Units Subcutaneous Q8H  . pantoprazole  40 mg Oral Daily  . piperacillin-tazobactam (ZOSYN)  IV  3.375 g Intravenous Q8H   Continuous Infusions: . 0.9 % NaCl with KCl 20 mEq / L 125 mL/hr at 04/23/16 2300     LOS: 2 days    Time spent: >30 MINS    [facility]  [facility] Pager [phone]. If 7PM-7AM, please contact night-coverage at [email or web address], password [email or web address] 04/24/2016, 8:56 AM  LOS: 2 days

## 2016-04-24 NOTE — Progress Notes (Signed)
  Subjective:  Patient is hungry. He is not having any pain or nausea with clear liquids. He has very mild discomfort in epigastric region to the left of midline. Patient states this episode of pancreatitis has been worse than his first episode 2 years ago.    Objective: Blood pressure 130/67, pulse 83, temperature 98.1 F (36.7 C), temperature source Oral, resp. rate 22, height 6\' 1"  (1.854 m), weight 327 lb 8 oz (148.553 kg), SpO2 97 %. Patient is alert and in no acute distress. Abdomen is obese. Bowel sounds are normal. On palpation abdomen is soft with mild epigastric tenderness on deep palpation. No organomegaly or masses. No LE edema or clubbing noted.  Labs/studies Results:   Recent Labs  04/22/16 1630 04/23/16 0530 04/24/16 0641  WBC 13.9* 10.8* 11.4*  HGB 14.6 12.9* 12.5*  HCT 44.3 40.4 39.3  PLT 274 262 250    BMET   Recent Labs  04/22/16 1630 04/23/16 0530 04/24/16 0641  NA 138 141 137  K 3.4* 3.2* 3.5  CL 103 107 106  CO2 27 28 26   GLUCOSE 105* 94 81  BUN 13 12 9   CREATININE 0.77 0.77 0.74  CALCIUM 9.5 8.5* 8.5*    LFT   Recent Labs  04/22/16 1630 04/23/16 0530 04/24/16 0641  PROT 8.0 6.9 7.0  ALBUMIN 3.9 3.3* 3.2*  AST 451* 239* 79*  ALT 578* 441* 261*  ALKPHOS 169* 148* 135*  BILITOT 4.1* 4.8* 1.5*     Assessment:  #1.Biliary pancreatitis. Significant drop in transaminases and bilirubin implying on 10 years passage of common duct stone. Given normal-sized bile duct no indication for ERCP. This is second episode of biliary pancreatitis. Patient needs to get his gallbladder out before he has another episode. Appreciate Dr. Earlene Plateravis seeing the patient for cholecystectomy to be done on an outpatient basis.  Mild anemia. No evidence of GI bleed.    Recommendations:  Advance diet to full liquids. Unless pain recurs he can be discharged later today. He will have LFTs repeated prior to cholecystectomy.  Will sign off.

## 2016-04-24 NOTE — Consult Note (Signed)
SURGICAL CONSULTATION NOTE (initial)  HISTORY OF PRESENT ILLNESS (HPI):  50 y.o. male presented to [facility] ED with acute onset of abdominal [place] [facility] which [facility] was [facility] and diagnosed with gallstone pancreatitis, but he opted to drive home to be treated at [facility] instead. He reports the pain began ~2 am following a late dinner including fatty foods, and upon presentation, his total bilirubin, LFT's, and lipase were significantly elevated. Patient and his wife describe a similar episode 2-3 years ago, at which time he was advised to follow up with a surgeon for removal of his gallbladder and advised that gallstone pancreatitis or similar related problems could occur again, whether in months or years, but he did not follow up for this at that time and recalls one episode of mild post-prandial abdominal pain since that episode. His pain has currently nearly resolved, and he denies nausea/vomiting, fever/chills, CP, or SOB.  PAST MEDICAL HISTORY (PMH):  Past Medical History  Diagnosis Date  . Gallstone      PAST SURGICAL HISTORY [facility]):  Past Surgical History  Procedure Laterality Date  . No past surgeries       MEDICATIONS:  Prior to Admission medications   Not on File     ALLERGIES:  No Known Allergies   SOCIAL HISTORY:  Social History   Social History  . Marital Status: Married    Spouse Name: N/A  . Number of Children: N/A  . Years of Education: N/A   Occupational History  . Not on file.   Social History Main Topics  . Smoking status: Current Every Day Smoker -- 0.50 packs/day    Types: Cigarettes  . Smokeless tobacco: Not on file  . Alcohol Use: 0.0 oz/week    0 Standard drinks or equivalent per week     Comment: occ, 1-2 12 ounce beers about once a week (04/23/16)  . Drug Use: No  . Sexual Activity: Not on file   Other Topics Concern  . Not on file   Social History Narrative    The patient  currently resides (home / rehab facility / nursing home): Home  The patient normally is (ambulatory / bedbound): Ambulatory   FAMILY HISTORY:  Family History  Problem Relation Age of Onset  . Colon cancer Neg Hx   . Pancreatic disease Neg Hx   . Liver disease Neg Hx    REVIEW OF SYSTEMS:  Constitutional: denies weight loss, fever, chills, or sweats  Eyes: denies any other vision changes, history of eye injury  ENT: denies sore throat, hearing problems  Respiratory: denies shortness of breath, wheezing  Cardiovascular: denies chest pain, palpitations  Gastrointestinal: abdominal pain and N/V as per HPI  Musculoskeletal: denies any other joint pains or cramps  Skin: denies any other rashes or skin discolorations  Neurological: denies any other headache, dizziness, weakness  Psychiatric: denies any other depression, anxiety   All other review of systems were negative   VITAL SIGNS:  Temp:  [98.1 F (36.7 C)-98.8 F (37.1 C)] 98.1 F (36.7 C) (07/08 0556) Pulse Rate:  [83-84] 83 (07/08 0556) Resp:  [18-22] 22 (07/08 0556) BP: (130-133)/(67-84) 130/67 mmHg (07/08 0556) SpO2:  [97 %-98 %] 97 % (07/08 0556) Weight:  [148.553 kg (327 lb 8 oz)] 148.553 kg (327 lb 8 oz) (07/08 0600)     Height: 6\' 1"  (185.4 cm) Weight: (!) 148.553 kg (327 lb 8 oz) BMI (Calculated): 42.6  INTAKE/OUTPUT:  This shift:    Last 2 shifts: @   PHYSICAL EXAM:  Constitutional:  -- Obese body habitus  -- Awake, alert, and oriented x3  Eyes:  -- Pupils equally round and reactive to light  -- No scleral icterus  Ear, nose, and throat:  -- No jugular venous distension  Pulmonary:  -- No crackles  -- Equal breath sounds bilaterally  Cardiovascular:  -- S1, S2 present  -- No pericardial rubs Abdomen:  -- Soft, obese, minimal epigastric tenderness with palpation, nondistended, no guarding/rebound  -- No abdominal masses appreciated, pulsatile or otherwise  Musculoskeletal /  Integumentary:  -- Wounds or skin discoloration: None appreciated  -- Extremities: B/L UE and LE FROM, hands and feet warm  Neurologic:  -- Motor function: intact and symmetric -- Sensation: intact and symmetric   Labs:  CBC:  Lab Results  Component Value Date   WBC 11.4* 04/24/2016   RBC 4.56 04/24/2016    CMP     Component Value Date/Time   NA 137 04/24/2016 0641   K 3.5 04/24/2016 0641   CL 106 04/24/2016 0641   CO2 26 04/24/2016 0641   GLUCOSE 81 04/24/2016 0641   BUN 9 04/24/2016 0641   CREATININE 0.74 04/24/2016 0641   CALCIUM 8.5* 04/24/2016 0641   PROT 7.0 04/24/2016 0641   ALBUMIN 3.2* 04/24/2016 0641   AST 79* 04/24/2016 0641   ALT 261* (down from 441 04/23/2016) 04/24/2016 0641   ALKPHOS 135* (down from 148 04/23/2016) 04/24/2016 0641   BILITOT 1.5* (down from 4.8 04/23/2016) 04/24/2016 0641   GFRNONAA >60 04/24/2016 0641   GFRAA >60 04/24/2016 0641   Lipase 168 (down from 704 04/23/2016)   Imaging studies:  Outside hospital CT Abdomen and Pelvis with Contrast (04/22/2016) - per report, unavailable for personal review Minimal peri-pancreatic inflammatory stranding, suggestive of mild pancreatitis, +gallstones, otherwise unremarkable  Outside hospital Abdominal Ultrasound (04/22/2016) - per report, unavailable for personal review Acoustic shadowing in the gallbladder fossa suggesting numerous gallstones without gallbladder wall thickening or peri-cholecystic fluid/inflammation, 3.7 mm common bile duct diameter  Abdominal Ultrasound (05/08/2013) Gallbladder: Numerous echogenic shadowing intraluminal gallstones. Wall thickness is mildly thickened measuring 3.9 mm.Despite this, no Murphy's sign elicited during the study.  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   - advised avoidance of fatty foods  - follow-up outpatient this week for  semi-elective cholecystectomy  - weight loss and smoking cessation for both peri-operative and long-term health, quality of life  - ambulation encouraged, discharge planning as per medical team  - DVT prophylaxis  All of the above findings and recommendations were discussed with the patient and his wife, and all of his and family's questions were answered to their expressed satisfaction.  Thank you for the opportunity to participate in this patient's care.   -- Scherrie Gerlach Earlene Plater, MD, RPVI [place]: [facility] Office: [phone]

## 2016-04-28 NOTE — H&P (Signed)
Surgical Admission History & Physical  Subjective: 50 year old morbidly obese Male (BMI 43) seen for inpatient consultation 4 days ago as recovering from first and thus far only episode of gallstone pancreatitis. Patient reports epiganstric abdominal pain has improved, though not completely resolved. He denies any post-prandial pain and affirms that he has not drank any alcohol since his recent hospital admission for pancreatitis attributed to gallstones seen on ultrasound during the same hospital admission.  Review of Symptoms:  Constitutional:No fevers, chills, or unexplained weight loss Head:Atraumatic; no masses; no abnormalities Eyes:No visual changes or eye pain Nose/Mouth/Throat:No nasal congestion, rhinorrhea, oral lesions, postnasal drip or sore throat  Cardiovascular: No chest pain or palpitations.  Respiratory:No cough, shortness of breath or wheezing  GastrointestinAbdominal pain as per HPI; No diarrhea, constipation, blood in stools, vomiting or heartburn Genitourinary:No urinary frequency, hematuria, incontinence, or dysuria Musculoskeletal:No arthalgias, myalgias or joint swelling Skin:No rash or bothersome skin lesions  Past Medical History: Morbid obesity (BMI 43), gallstone pancreatitis  Past Surgical History: None  Social History: Preferred Language: English Race:  Black or African American Ethnicity: Not Hispanic / Latino Age: [age] year Marital Status:  M Smoking Status: Current every day 1/2 pack per day smoker Alcohol:  1-2 12 oz beers per week Recreational drug(s):  denies  Functional Status: ------------------------------------------------ Bathing: Normal Cooking: Normal Dressing: Normal Driving: Normal Eating: Normal Managing Meds: Normal Oral Care: Normal Shopping: Normal Toileting: Normal Transferring: Normal Walking: Normal  Cognitive Status: ------------------------------------------------ Attention: Normal Decision Making:  Normal Language: Normal Memory: Normal Motor: Normal Perception: Normal Problem Solving: Normal Visual and Spatial: Normal  Family History: Reviewed, non-contributory  Vital Signs as of 04/27/2016:   Systolic 177: Diastolic 120: Heart Rate 111: Temp 98.74F (Temporal) Height 196ft 0in: Weight 314Lbs 0 Ounces: Pain Level 6: BMI 42.59  Physical Exam: General:Morbidly obese, otherwise well appearing, well nourished in no distress. Skin: no rash or prominent lesions Head:Atraumatic; no masses; no abnormalities Eyes:conjunctiva clear, EOM intact, PERRL Mouth:Mucous membranes moist, no mucosal lesions. Throat:no erythema, exudates or lesions. Neck:Supple without lymphadenopathy.  Heart:RRR, no murmur Lungs: CTA though equally decreased attributable to body habitus bilaterally, no wheezes, rhonchi, rales.  Breathing unlabored. Abdomen: Soft, morbidly obese, mild epigastric tenderness to palpation, ND, no HSM, no masses appreciated. Extremities:No deformities, clubbing, cyanosis, or edema.  Assessment: 50 year old morbidly obese Male recoering from acute pancreatitis attributed to cholelithiasis visualized on ultrasound during recent hospital admission  Plans:  - patient advised to avoid fatty foods (meats, cheeses, dairy, and fried foods) until after cholecystectomy  - also advised to abstain from alcohol while recovering from pancreatitis and reiterated importance of smoking cessation + weight loss  - all risks, benefits, and alternatives to cholecystectomy discussed and patient's and his wife's questions answered to their expressed satisfaction  - informed consent obtained, will plan for laparoscopic cholecystectomy Friday, 04/30/2016

## 2016-04-29 ENCOUNTER — Encounter (HOSPITAL_COMMUNITY): Payer: Self-pay

## 2016-04-29 ENCOUNTER — Encounter (HOSPITAL_COMMUNITY)
Admission: RE | Admit: 2016-04-29 | Discharge: 2016-04-29 | Disposition: A | Discharge status: Home / Self Care | Payer: BLUE CROSS/BLUE SHIELD | Source: Ambulatory Visit | Attending: Surgery | Admitting: Surgery

## 2016-04-30 ENCOUNTER — Ambulatory Visit (HOSPITAL_COMMUNITY)
Admission: RE | Admit: 2016-04-30 | Discharge: 2016-04-30 | Disposition: A | Discharge status: Home / Self Care | Payer: BLUE CROSS/BLUE SHIELD | Source: Ambulatory Visit | Attending: Surgery | Admitting: Surgery

## 2016-04-30 ENCOUNTER — Ambulatory Visit (HOSPITAL_COMMUNITY): Payer: BLUE CROSS/BLUE SHIELD | Admitting: Anesthesiology

## 2016-04-30 ENCOUNTER — Encounter (HOSPITAL_COMMUNITY): Payer: Self-pay | Admitting: *Deleted

## 2016-04-30 ENCOUNTER — Encounter (HOSPITAL_COMMUNITY)
Admission: RE | Disposition: A | Discharge status: Home / Self Care | Payer: Self-pay | Source: Ambulatory Visit | Attending: Surgery

## 2016-04-30 DIAGNOSIS — K851 Biliary acute pancreatitis without necrosis or infection: Secondary | ICD-10-CM | POA: Insufficient documentation

## 2016-04-30 DIAGNOSIS — F17200 Nicotine dependence, unspecified, uncomplicated: Secondary | ICD-10-CM | POA: Insufficient documentation

## 2016-04-30 DIAGNOSIS — K859 Acute pancreatitis without necrosis or infection, unspecified: Secondary | ICD-10-CM | POA: Diagnosis present

## 2016-04-30 DIAGNOSIS — Z6841 Body Mass Index (BMI) 40.0 and over, adult: Secondary | ICD-10-CM | POA: Insufficient documentation

## 2016-04-30 HISTORY — PX: CHOLECYSTECTOMY: SHX55

## 2016-04-30 SURGERY — LAPAROSCOPIC CHOLECYSTECTOMY
Anesthesia: General | Site: Abdomen

## 2016-04-30 MED ORDER — LACTATED RINGERS IV SOLN
INTRAVENOUS | Status: DC
  Administered 2016-04-30: 12:00:00 via INTRAVENOUS

## 2016-04-30 MED ORDER — PROPOFOL 10 MG/ML IV BOLUS
INTRAVENOUS | Status: AC
  Filled 2016-04-30: qty 20

## 2016-04-30 MED ORDER — SUCCINYLCHOLINE CHLORIDE 20 MG/ML IJ SOLN
INTRAMUSCULAR | Status: AC
  Filled 2016-04-30: qty 1

## 2016-04-30 MED ORDER — FENTANYL CITRATE (PF) 100 MCG/2ML IJ SOLN
INTRAMUSCULAR | Status: DC | PRN
  Administered 2016-04-30 (×4): 50 ug via INTRAVENOUS
  Administered 2016-04-30: 100 ug via INTRAVENOUS
  Administered 2016-04-30 (×2): 50 ug via INTRAVENOUS

## 2016-04-30 MED ORDER — OXYCODONE-ACETAMINOPHEN 5-325 MG PO TABS: 1.0000 | ORAL_TABLET | ORAL | Status: DC | PRN

## 2016-04-30 MED ORDER — MIDAZOLAM HCL 2 MG/2ML IJ SOLN
1.0000 mg | INTRAMUSCULAR | Status: DC | PRN
  Administered 2016-04-30 (×2): 2 mg via INTRAVENOUS

## 2016-04-30 MED ORDER — ONDANSETRON HCL 4 MG/2ML IJ SOLN
INTRAMUSCULAR | Status: AC
  Filled 2016-04-30: qty 2

## 2016-04-30 MED ORDER — CEFAZOLIN IN D5W 1 GM/50ML IV SOLN
1.0000 g | Freq: Once | INTRAVENOUS | Status: AC
  Administered 2016-04-30: 1 g via INTRAVENOUS

## 2016-04-30 MED ORDER — DEXAMETHASONE SODIUM PHOSPHATE 4 MG/ML IJ SOLN
4.0000 mg | Freq: Once | INTRAMUSCULAR | Status: AC
  Administered 2016-04-30: 4 mg via INTRAVENOUS

## 2016-04-30 MED ORDER — POVIDONE-IODINE 10 % OINT PACKET
TOPICAL_OINTMENT | CUTANEOUS | Status: DC | PRN
  Administered 2016-04-30: 1 via TOPICAL

## 2016-04-30 MED ORDER — PHENYLEPHRINE 40 MCG/ML (10ML) SYRINGE FOR IV PUSH (FOR BLOOD PRESSURE SUPPORT)
PREFILLED_SYRINGE | INTRAVENOUS | Status: AC
  Filled 2016-04-30: qty 10

## 2016-04-30 MED ORDER — 0.9 % SODIUM CHLORIDE (POUR BTL) OPTIME
TOPICAL | Status: DC | PRN
  Administered 2016-04-30: 1000 mL

## 2016-04-30 MED ORDER — LIDOCAINE HCL (PF) 1 % IJ SOLN
INTRAMUSCULAR | Status: AC
  Filled 2016-04-30: qty 30

## 2016-04-30 MED ORDER — FENTANYL CITRATE (PF) 250 MCG/5ML IJ SOLN
INTRAMUSCULAR | Status: AC
  Filled 2016-04-30: qty 5

## 2016-04-30 MED ORDER — MIDAZOLAM HCL 2 MG/2ML IJ SOLN
INTRAMUSCULAR | Status: AC
  Filled 2016-04-30: qty 2

## 2016-04-30 MED ORDER — PHENYLEPHRINE HCL 10 MG/ML IJ SOLN
INTRAMUSCULAR | Status: AC
  Filled 2016-04-30: qty 1

## 2016-04-30 MED ORDER — CHLORHEXIDINE GLUCONATE CLOTH 2 % EX PADS: 6.0000 | MEDICATED_PAD | Freq: Once | CUTANEOUS | Status: DC

## 2016-04-30 MED ORDER — SODIUM CHLORIDE 0.9 % IR SOLN
Status: DC | PRN
  Administered 2016-04-30: 3000 mL

## 2016-04-30 MED ORDER — GLYCOPYRROLATE 0.2 MG/ML IJ SOLN
INTRAMUSCULAR | Status: DC | PRN
  Administered 2016-04-30: .4 mg via INTRAVENOUS

## 2016-04-30 MED ORDER — PROPOFOL 10 MG/ML IV BOLUS
INTRAVENOUS | Status: DC | PRN
  Administered 2016-04-30: 50 mg via INTRAVENOUS
  Administered 2016-04-30: 200 mg via INTRAVENOUS

## 2016-04-30 MED ORDER — FENTANYL CITRATE (PF) 100 MCG/2ML IJ SOLN: 25.0000 ug | INTRAMUSCULAR | Status: DC | PRN

## 2016-04-30 MED ORDER — LIDOCAINE HCL 1 % IJ SOLN
INTRAMUSCULAR | Status: DC | PRN
  Administered 2016-04-30: 18 mL via INTRAMUSCULAR
  Administered 2016-04-30: 2 mL via INTRAMUSCULAR

## 2016-04-30 MED ORDER — ONDANSETRON HCL 4 MG/2ML IJ SOLN: 4.0000 mg | Freq: Once | INTRAMUSCULAR | Status: DC | PRN

## 2016-04-30 MED ORDER — CEFAZOLIN SODIUM-DEXTROSE 2-4 GM/100ML-% IV SOLN
INTRAVENOUS | Status: AC
  Filled 2016-04-30: qty 100

## 2016-04-30 MED ORDER — SUCCINYLCHOLINE CHLORIDE 200 MG/10ML IV SOSY: PREFILLED_SYRINGE | INTRAVENOUS | Status: DC | PRN

## 2016-04-30 MED ORDER — DEXTROSE 5 % IV SOLN: 3.0000 g | INTRAVENOUS | Status: DC

## 2016-04-30 MED ORDER — NEOSTIGMINE METHYLSULFATE 5 MG/5ML IV SOSY
PREFILLED_SYRINGE | INTRAVENOUS | Status: DC | PRN
  Administered 2016-04-30: 3 mg via INTRAVENOUS

## 2016-04-30 MED ORDER — SUCCINYLCHOLINE CHLORIDE 20 MG/ML IJ SOLN
INTRAMUSCULAR | Status: DC | PRN
  Administered 2016-04-30: 140 mg via INTRAVENOUS

## 2016-04-30 MED ORDER — HEMOSTATIC AGENTS (NO CHARGE) OPTIME
TOPICAL | Status: DC | PRN
  Administered 2016-04-30: 1 via TOPICAL

## 2016-04-30 MED ORDER — LACTATED RINGERS IV SOLN
INTRAVENOUS | Status: DC | PRN
  Administered 2016-04-30 (×2): via INTRAVENOUS

## 2016-04-30 MED ORDER — SUCCINYLCHOLINE CHLORIDE 20 MG/ML IJ SOLN: INTRAMUSCULAR | Status: DC | PRN

## 2016-04-30 MED ORDER — CEFAZOLIN SODIUM-DEXTROSE 2-4 GM/100ML-% IV SOLN
2.0000 g | Freq: Three times a day (TID) | INTRAVENOUS | Status: AC
  Administered 2016-04-30: 2 g via INTRAVENOUS

## 2016-04-30 MED ORDER — GLYCOPYRROLATE 0.2 MG/ML IJ SOLN
INTRAMUSCULAR | Status: AC
  Filled 2016-04-30: qty 1

## 2016-04-30 MED ORDER — GLYCOPYRROLATE 0.2 MG/ML IJ SOLN
0.2000 mg | Freq: Once | INTRAMUSCULAR | Status: AC
  Administered 2016-04-30: 0.2 mg via INTRAVENOUS

## 2016-04-30 MED ORDER — CEFAZOLIN IN D5W 1 GM/50ML IV SOLN
INTRAVENOUS | Status: AC
  Filled 2016-04-30: qty 50

## 2016-04-30 MED ORDER — BUPIVACAINE HCL (PF) 0.5 % IJ SOLN
INTRAMUSCULAR | Status: AC
  Filled 2016-04-30: qty 30

## 2016-04-30 MED ORDER — PHENYLEPHRINE HCL 10 MG/ML IJ SOLN
INTRAMUSCULAR | Status: DC | PRN
  Administered 2016-04-30: 80 ug via INTRAVENOUS
  Administered 2016-04-30 (×2): 40 ug via INTRAVENOUS

## 2016-04-30 MED ORDER — NEOSTIGMINE METHYLSULFATE 10 MG/10ML IV SOLN
INTRAVENOUS | Status: AC
  Filled 2016-04-30: qty 1

## 2016-04-30 MED ORDER — ROCURONIUM BROMIDE 100 MG/10ML IV SOLN
INTRAVENOUS | Status: DC | PRN
  Administered 2016-04-30: 30 mg via INTRAVENOUS
  Administered 2016-04-30: 20 mg via INTRAVENOUS

## 2016-04-30 MED ORDER — GLYCOPYRROLATE 0.2 MG/ML IJ SOLN
INTRAMUSCULAR | Status: AC
  Filled 2016-04-30: qty 3

## 2016-04-30 MED ORDER — ONDANSETRON HCL 4 MG/2ML IJ SOLN
4.0000 mg | Freq: Once | INTRAMUSCULAR | Status: AC
  Administered 2016-04-30: 4 mg via INTRAVENOUS

## 2016-04-30 MED ORDER — CEFAZOLIN SODIUM-DEXTROSE 2-4 GM/100ML-% IV SOLN
2.0000 g | Freq: Three times a day (TID) | INTRAVENOUS | Status: DC
  Administered 2016-04-30: 2 g via INTRAVENOUS
  Filled 2016-04-30: qty 100

## 2016-04-30 MED ORDER — POVIDONE-IODINE 10 % EX OINT
TOPICAL_OINTMENT | CUTANEOUS | Status: AC
  Filled 2016-04-30: qty 1

## 2016-04-30 MED ORDER — DEXAMETHASONE SODIUM PHOSPHATE 4 MG/ML IJ SOLN
INTRAMUSCULAR | Status: AC
  Filled 2016-04-30: qty 1

## 2016-04-30 MED ORDER — ROCURONIUM BROMIDE 50 MG/5ML IV SOLN
INTRAVENOUS | Status: AC
  Filled 2016-04-30: qty 1

## 2016-04-30 MED ORDER — LIDOCAINE HCL (PF) 1 % IJ SOLN
INTRAMUSCULAR | Status: AC
  Filled 2016-04-30: qty 5

## 2016-04-30 SURGICAL SUPPLY — 49 items
APPLIER CLIP LAPSCP 10X32 DD (CLIP) ×3 IMPLANT
BAG HAMPER (MISCELLANEOUS) ×3 IMPLANT
BAG SPEC RTRVL LRG 6X4 10 (ENDOMECHANICALS) ×1
CHLORAPREP W/TINT 26ML (MISCELLANEOUS) ×3 IMPLANT
CLOTH BEACON ORANGE TIMEOUT ST (SAFETY) ×3 IMPLANT
COVER LIGHT HANDLE STERIS (MISCELLANEOUS) ×6 IMPLANT
DECANTER SPIKE VIAL GLASS SM (MISCELLANEOUS) ×6 IMPLANT
DERMABOND ADVANCED (GAUZE/BANDAGES/DRESSINGS) ×2
DERMABOND ADVANCED .7 DNX12 (GAUZE/BANDAGES/DRESSINGS) ×1 IMPLANT
DEVICE TROCAR PUNCTURE CLOSURE (ENDOMECHANICALS) ×3 IMPLANT
ELECT REM PT RETURN 9FT ADLT (ELECTROSURGICAL) ×3
ELECTRODE REM PT RTRN 9FT ADLT (ELECTROSURGICAL) ×1 IMPLANT
FILTER SMOKE EVAC LAPAROSHD (FILTER) ×3 IMPLANT
FORMALIN 10 PREFIL 120ML (MISCELLANEOUS) ×3 IMPLANT
GLOVE BIO SURGEON STRL SZ7 (GLOVE) ×6 IMPLANT
GLOVE BIOGEL PI IND STRL 7.0 (GLOVE) ×5 IMPLANT
GLOVE BIOGEL PI IND STRL 7.5 (GLOVE) ×1 IMPLANT
GLOVE BIOGEL PI INDICATOR 7.0 (GLOVE) ×10
GLOVE BIOGEL PI INDICATOR 7.5 (GLOVE) ×2
GLOVE ECLIPSE 6.5 STRL STRAW (GLOVE) ×3 IMPLANT
GLOVE ECLIPSE 7.0 STRL STRAW (GLOVE) ×6 IMPLANT
GOWN STRL REUS W/ TWL XL LVL3 (GOWN DISPOSABLE) ×3 IMPLANT
GOWN STRL REUS W/TWL LRG LVL3 (GOWN DISPOSABLE) ×6 IMPLANT
GOWN STRL REUS W/TWL XL LVL3 (GOWN DISPOSABLE) ×9
HEMOSTAT SNOW SURGICEL 2X4 (HEMOSTASIS) ×3 IMPLANT
INST SET LAPROSCOPIC AP (KITS) ×3 IMPLANT
IV NS IRRIG 3000ML ARTHROMATIC (IV SOLUTION) ×3 IMPLANT
KIT ROOM TURNOVER APOR (KITS) ×3 IMPLANT
MANIFOLD NEPTUNE II (INSTRUMENTS) ×3 IMPLANT
NEEDLE INSUFFLATION 14GA 120MM (NEEDLE) ×3 IMPLANT
NS IRRIG 1000ML POUR BTL (IV SOLUTION) ×3 IMPLANT
PACK LAP CHOLE LZT030E (CUSTOM PROCEDURE TRAY) ×3 IMPLANT
PAD ARMBOARD 7.5X6 YLW CONV (MISCELLANEOUS) ×3 IMPLANT
POUCH SPECIMEN RETRIEVAL 10MM (ENDOMECHANICALS) ×3 IMPLANT
SET BASIN LINEN APH (SET/KITS/TRAYS/PACK) ×3 IMPLANT
SET TUBE IRRIG SUCTION NO TIP (IRRIGATION / IRRIGATOR) ×3 IMPLANT
SLEEVE ENDOPATH XCEL 5M (ENDOMECHANICALS) ×6 IMPLANT
SPONGE GAUZE 2X2 8PLY STER LF (GAUZE/BANDAGES/DRESSINGS) ×1
SPONGE GAUZE 2X2 8PLY STRL LF (GAUZE/BANDAGES/DRESSINGS) ×2 IMPLANT
STAPLER VISISTAT (STAPLE) ×3 IMPLANT
SUT VIC AB 4-0 PS2 27 (SUTURE) ×3 IMPLANT
SUT VICRYL 0 UR6 27IN ABS (SUTURE) ×9 IMPLANT
TAPE CLOTH SURG 4X10 WHT LF (GAUZE/BANDAGES/DRESSINGS) ×3 IMPLANT
TROCAR ENDO BLADELESS 11MM (ENDOMECHANICALS) ×3 IMPLANT
TROCAR XCEL NON-BLD 5MMX100MML (ENDOMECHANICALS) ×3 IMPLANT
TUBE CONNECTING 12'X1/4 (SUCTIONS) ×1
TUBE CONNECTING 12X1/4 (SUCTIONS) ×2 IMPLANT
TUBING INSUFFLATION (TUBING) ×3 IMPLANT
WARMER LAPAROSCOPE (MISCELLANEOUS) ×3 IMPLANT

## 2016-04-30 NOTE — Discharge Instructions (Signed)
In addition to included general post-operative instructions for Laparoscopic Cholecystectomy,  Diet: Resume home heart healthy diet.   Activity: No heavy lifting (children, pets, laundry) or strenuous activity until follow-up, but light activity and walking are encouraged. Do not drive or drink alcohol if taking narcotic pain medications.   Wound care: 2 days after surgery (Sunday evening, 7/16), may shower/get incision wet with soapy water and pat dry (do not rub incisions), but no baths or submerging incision underwater until follow-up.   Medications: Resume all home medications. For mild to moderate pain: acetaminophen (Tylenol) or ibuprofen (if no kidney disease). Narcotic pain medications, if prescribed, can be used for severe pain, though may cause nausea, constipation, and drowsiness. Do not combine Tylenol and Percocet within a 6 hour period as Percocet contains Tylenol.  Call office [phone]) at any time if any questions, worsening pain, fevers/chills, bleeding, drainage from incision site, or other concerns.

## 2016-04-30 NOTE — Transfer of Care (Signed)
Immediate Anesthesia Transfer of Care Note  Patient: Timothy Dalton  Procedure(s) Performed: Procedure(s): LAPAROSCOPIC CHOLECYSTECTOMY (N/A)  Patient Location: PACU  Anesthesia Type:General  Level of Consciousness: awake  Airway & Oxygen Therapy: Patient Spontanous Breathing and Patient connected to face mask oxygen  Post-op Assessment: Report given to RN  Post vital signs: Reviewed and stable  Last Vitals:  Filed Vitals:   04/30/16 1240 04/30/16 1245  BP: 128/85 136/90  Pulse:    Temp:    Resp: 14 12    Last Pain: There were no vitals filed for this visit.    Patients Stated Pain Goal: 2 (04/30/16 1115)  Complications: No apparent anesthesia complications

## 2016-04-30 NOTE — Anesthesia Procedure Notes (Signed)
Procedure Name: Intubation Date/Time: 04/30/2016 1:07 PM Performed by: Glynn OctaveANIEL, Timothy Dalton Pre-anesthesia Checklist: Patient identified, Patient being monitored, Timeout performed, Emergency Drugs available and Suction available Patient Re-evaluated:Patient Re-evaluated prior to inductionOxygen Delivery Method: Circle system utilized Preoxygenation: Pre-oxygenation with 100% oxygen Intubation Type: IV induction Ventilation: Mask ventilation without difficulty Laryngoscope Size: Mac and 3 Grade View: Grade III Tube type: Oral Tube size: 8.0 mm Number of attempts: 2 Airway Equipment and Method: Stylet and Video-laryngoscopy Placement Confirmation: ETT inserted through vocal cords under direct vision,  positive ETCO2 and breath sounds checked- equal and bilateral Secured at: 21 cm Tube secured with: Tape Dental Injury: Teeth and Oropharynx as per pre-operative assessment

## 2016-04-30 NOTE — Anesthesia Preprocedure Evaluation (Signed)
Anesthesia Evaluation  Patient identified by MRN, date of birth, ID band Patient awake    Reviewed: Allergy & Precautions, NPO status , Patient's Chart, lab work & pertinent test results  Airway Mallampati: III  TM Distance: >3 FB     Dental  (+) Teeth Intact, Dental Advisory Given   Pulmonary Current Smoker,    breath sounds clear to auscultation       Cardiovascular negative cardio ROS   Rhythm:Regular Rate:Normal     Neuro/Psych    GI/Hepatic negative GI ROS,   Endo/Other  Morbid obesity  Renal/GU      Musculoskeletal   Abdominal   Peds  Hematology   Anesthesia Other Findings   Reproductive/Obstetrics                             Anesthesia Physical Anesthesia Plan  ASA: II  Anesthesia Plan: General   Post-op Pain Management:    Induction: Intravenous, Rapid sequence and Cricoid pressure planned  Airway Management Planned: Oral ETT and Video Laryngoscope Planned  Additional Equipment:   Intra-op Plan:   Post-operative Plan: Extubation in OR  Informed Consent: I have reviewed the patients History and Physical, chart, labs and discussed the procedure including the risks, benefits and alternatives for the proposed anesthesia with the patient or authorized representative who has indicated his/her understanding and acceptance.     Plan Discussed with:   Anesthesia Plan Comments:         Anesthesia Quick Evaluation

## 2016-04-30 NOTE — Anesthesia Postprocedure Evaluation (Signed)
Anesthesia Post Note  Patient: Erion L Biggar  Procedure(s) Performed: Procedure(s) (LRB): LAPAROSCOPIC CHOLECYSTECTOMY (N/A)  Patient location during evaluation: PACU Anesthesia Type: General Level of consciousness: awake and alert Pain management: pain level controlled Vital Signs Assessment: post-procedure vital signs reviewed and stable Respiratory status: spontaneous breathing Cardiovascular status: blood pressure returned to baseline Postop Assessment: no signs of nausea or vomiting Anesthetic complications: no    Last Vitals:  Filed Vitals:   04/30/16 1600 04/30/16 1604  BP: 157/87   Pulse: 96 93  Temp:    Resp: 19 16    Last Pain:  Filed Vitals:   04/30/16 1612  PainSc: 7                  Joey Lierman

## 2016-04-30 NOTE — Addendum Note (Signed)
Addendum  created 04/30/16 1632 by Moshe SalisburyKaren E Shequilla Goodgame, CRNA   Modules edited: Anesthesia Medication Administration

## 2016-04-30 NOTE — Progress Notes (Signed)
Awakwe. Sprite given to drink. Tolerated well.

## 2016-04-30 NOTE — Interval H&P Note (Signed)
History and Physical Interval Note:  04/30/2016 12:28 PM  Dail L Vangorder  has presented today for surgery, with the diagnosis of gallstone pancreatitis  The various methods of treatment have been discussed with the patient and family. After consideration of risks, benefits and other options for treatment, the patient has consented to  Procedure(s): LAPAROSCOPIC CHOLECYSTECTOMY (N/A) as a surgical intervention .  The patient's history has been reviewed, patient examined, no change in status, stable for surgery.  I have reviewed the patient's chart and labs.  Questions were answered to the patient's satisfaction.     Ancil LinseyJason Evan Jayana Kotula

## 2016-05-03 NOTE — Op Note (Signed)
SURGICAL OPERATIVE REPORT   DATE OF PROCEDURE: 05/03/2016  ATTENDING SURGEON(S): Ancil LinseyJason Evan Phyllis Whitefield, MD  ANESTHESIA: GETA  PRE-OPERATIVE DIAGNOSIS: Gallstone Pancreatitis  POST-OPERATIVE DIAGNOSIS: Severe Cholelithiasis with Gallstone Pancreatitis  PROCEDURE(S):  1.) Laparoscopic Cholecystectomy  INTRAOPERATIVE FINDINGS: Gallbladder filled and distended with hundreds of pea-sized gallstones, mild peri-cholecystic inflammation  INTRAOPERATIVE FLUIDS: 1600 mL crystalloid   ESTIMATED BLOOD LOSS: Minimal (<30 mL)   URINE OUTPUT: No foley  SPECIMENS: Gallbladder  IMPLANTS: None  DRAINS: None   COMPLICATIONS: None apparent   CONDITION AT COMPLETION: Hemodynamically stable and extubated  DISPOSITION: PACU   INDICATION(S) FOR PROCEDURE:  Patient is a 50 y.o. male who previously presented with gallstone pancreatitis. Ultrasound suggested cholelithiasis, while CT demonstrated acute pancreatitis. All risks, benefits, and alternatives to above elective procedures were discussed with the patient, who elected to proceed, and informed consent was accordingly obtained at that time.   DETAILS OF PROCEDURE:  Patient was brought to the operating suite and appropriately identified. General anesthesia was administered along with peri-operative prophylactic IV antibiotics, and endotracheal intubation was performed by anesthesiologist, along with NG/OG tube for gastric decompression. In supine position, operative site was prepped and draped in usual sterile fashion, and following a brief time out, initial 5 mm incision was made in a natural skin crease just above the umbilicus. Fascia was then elevated, and a Verress needle was inserted and its proper position confirmed using aspiration and saline meniscus test.  Upon insufflation of the abdominal cavity with carbon dioxide to a well-tolerated pressure of 12-15 mmHg, 5 mm peri-umbilical port followed by laparoscope were inserted and used to  inspect the abdominal cavity and its contents with no injuries from insertion of the first trochar noted. Three additional trocars were inserted, one at the epigastric position (10 mm) and two along the Right costal margin (5 mm). The table was then placed in reverse Trendelenburg position with the Right side up. Filmy adhesions between the gallbladder and omentum/duodenum/transverse colon were lysed using combined blunt and sharp dissection. The apex/dome of the gallbladder was grasped with an atraumatic grasper passed through the lateral port and retracted apically over the liver. The infundibulum was also grasped and retracted, exposing Calot's triangle. The peritoneum overlying the gallbladder infundibulum was incised and dissected free of surrounding peritoneal attachments, revealing the cystic duct and cystic artery, which were clipped twice on the patient side and once on the gallbladder specimen side close to the gallbladder. The gallbladder was then dissected from its peritoneal attachments to the liver using electrocautery, and the gallbladder was placed into a laparoscopic specimen bag and removed from the abdominal cavity via the epigastric port site. Hemostasis and secure placement of clips were confirmed, and intra-peritoneal cavity was inspected with no additional findings. Endoclose laparoscopic fascial closure device was then used to re-approximate fascia at the 10 mm epigastric port site.  All ports were then removed under direct visualization, and abdominal cavity was desuflated. All port sites were irrigated/cleaned, additional local anesthetic was injected at each incision, 3-0 Vicryl was used to re-approximate dermis at 10 mm port site(s), and surgical skin staples were used to re-approximate skin. Skin was then cleaned, dried, and sterile dressings were applied. Patient was then safely able to be awakened, extubated, and transferred to PACU for post-operative monitoring and care.   I was  present for all aspects of procedure, and there were no intra-operative complications apparent.

## 2016-05-06 ENCOUNTER — Encounter (HOSPITAL_COMMUNITY): Payer: Self-pay | Admitting: Surgery

## 2016-06-07 ENCOUNTER — Emergency Department (HOSPITAL_COMMUNITY)
Admission: EM | Admit: 2016-06-07 | Discharge: 2016-06-07 | Disposition: A | Discharge status: Home / Self Care | Payer: BLUE CROSS/BLUE SHIELD | Attending: Emergency Medicine | Admitting: Emergency Medicine

## 2016-06-07 ENCOUNTER — Encounter (HOSPITAL_COMMUNITY): Payer: Self-pay | Admitting: Emergency Medicine

## 2016-06-07 DIAGNOSIS — F17210 Nicotine dependence, cigarettes, uncomplicated: Secondary | ICD-10-CM | POA: Diagnosis not present

## 2016-06-07 DIAGNOSIS — H578 Other specified disorders of eye and adnexa: Secondary | ICD-10-CM | POA: Diagnosis present

## 2016-06-07 DIAGNOSIS — H109 Unspecified conjunctivitis: Secondary | ICD-10-CM | POA: Diagnosis not present

## 2016-06-07 DIAGNOSIS — Z79899 Other long term (current) drug therapy: Secondary | ICD-10-CM | POA: Diagnosis not present

## 2016-06-07 MED ORDER — FLUORESCEIN SODIUM 1 MG OP STRP
1.0000 | ORAL_STRIP | Freq: Once | OPHTHALMIC | Status: AC
  Administered 2016-06-07: 1 via OPHTHALMIC
  Filled 2016-06-07: qty 1

## 2016-06-07 MED ORDER — TOBRAMYCIN 0.3 % OP SOLN
2.0000 [drp] | Freq: Once | OPHTHALMIC | Status: AC
  Administered 2016-06-07: 2 [drp] via OPHTHALMIC
  Filled 2016-06-07: qty 5

## 2016-06-07 MED ORDER — TETRACAINE HCL 0.5 % OP SOLN
2.0000 [drp] | Freq: Once | OPHTHALMIC | Status: AC
  Administered 2016-06-07: 2 [drp] via OPHTHALMIC
  Filled 2016-06-07: qty 4

## 2016-06-07 NOTE — Discharge Instructions (Signed)
Please use cool compresses to your eyes four times daily. Use dark glasses until this problem has resolved. Use 2 drops of tobramycin to each eye daily for 5 days.Wash hands frequently.

## 2016-06-07 NOTE — ED Notes (Signed)
Ph test completed on pt R. Eye . 8.0-basic solution. Ivery QualeHobson Bryant, PA notified.

## 2016-06-07 NOTE — ED Provider Notes (Signed)
[facility]-EM[facility] Provider Note   CSN: [id number] Arrival date & time: 06/07/16  0911     History   Chief Complaint Chief Complaint  Patient presents with  . Eye Problem    Chemical in R. eye     HPI Timothy Dalton is a 50 y.o. male.  Patient is a 50 year old male who presents to the emergency department with a complaint of "chemicals".  The patient states that he was working outside when he got some weed killer in his eye. He noticed burning and stinging. He noticed some light sensitivity, and soreness around the right eye in particular. After further questioning, the patient states that he had some increased redness of the right for the last couple days. He also noticed this morning that he had mucus around the eyelashes and had to open with his hand. He denies any double vision or unusual vision changes. He states that he tried to wash some of the chemicals out at home, but is not sure how successful it was.   The history is provided by the patient.  Eye Problem   Associated symptoms include discharge, photophobia and eye redness.    Past Medical History:  Diagnosis Date  . Gallstone     Patient Active Problem List   Diagnosis Date Noted  . Pancreatitis, acute 04/22/2016  . Abdominal pain, epigastric 04/22/2016  . Gallstones 04/22/2016  . Abnormal LFTs 04/22/2016  . Acute pancreatitis 04/22/2016  . Gallstone pancreatitis     Past Surgical History:  Procedure Laterality Date  . CHOLECYSTECTOMY N/A 04/30/2016   Procedure: LAPAROSCOPIC CHOLECYSTECTOMY;  Surgeon: Ancil LinseyJason Evan Davis, MD;  Location: [facility];  Service: General;  Laterality: N/A;  . NO PAST SURGERIES         Home Medications    Prior to Admission medications   Medication Sig Start Date End Date Taking? Authorizing Provider  oxyCODONE-acetaminophen (ROXICET) 5-325 MG tablet Take 1-2 tablets by mouth every 4 (four) hours as needed for severe pain. Patient not taking: Reported on 06/07/2016 04/30/16    Ancil LinseyJason Evan Davis, MD    Family History Family History  Problem Relation Age of Onset  . Colon cancer Neg Hx   . Pancreatic disease Neg Hx   . Liver disease Neg Hx     Social History Social History  Substance Use Topics  . Smoking status: Current Every Day Smoker    Packs/day: 0.50    Types: Cigarettes  . Smokeless tobacco: Never Used  . Alcohol use 0.0 oz/week     Comment: occ, 1-2 12 ounce beers about once a week (04/23/16)     Allergies   Review of patient's allergies indicates no known allergies.   Review of Systems Review of Systems  Eyes: Positive for photophobia, discharge and redness. Negative for visual disturbance.  All other systems reviewed and are negative.    Physical Exam Updated Vital Signs BP (!) 160/103 (BP Location: Left Arm)   Pulse 97   Temp 97.9 F (36.6 C) (Oral)   Resp 16   Ht 6\' 1"  (1.854 m)   Wt (!) 142.9 kg   SpO2 97%   BMI 41.56 kg/m   Physical Exam  Constitutional: He is oriented to person, place, and time. He appears well-developed and well-nourished.  Non-toxic appearance.  HENT:  Head: Normocephalic.  Right Ear: Tympanic membrane and external ear normal.  Left Ear: Tympanic membrane and external ear normal.  Eyes: EOM and lids are normal. Pupils are equal, round, and  reactive to light. Right eye exhibits discharge. Right eye exhibits no hordeolum. No foreign body present in the right eye. Left eye exhibits no discharge and no hordeolum. No foreign body present in the left eye. Right conjunctiva is injected. Right conjunctiva has no hemorrhage. Left conjunctiva is injected. Left conjunctiva has no hemorrhage. No scleral icterus.  Fundoscopic exam:      The right eye shows no AV nicking, no exudate and no papilledema.       The left eye shows no AV nicking, no exudate and no papilledema.  Slit lamp exam:      The right eye shows no corneal abrasion, no corneal flare, no corneal ulcer, no foreign body and no hyphema.  Neck: Normal  range of motion. Neck supple. Carotid bruit is not present.  Cardiovascular: Normal rate, regular rhythm, normal heart sounds, intact distal pulses and normal pulses.   Pulmonary/Chest: Breath sounds normal. No respiratory distress.  Abdominal: Soft. Bowel sounds are normal. There is no tenderness. There is no guarding.  Musculoskeletal: Normal range of motion.  Lymphadenopathy:       Head (right side): No submandibular adenopathy present.       Head (left side): No submandibular adenopathy present.    He has no cervical adenopathy.  Neurological: He is alert and oriented to person, place, and time. He has normal strength. No cranial nerve deficit or sensory deficit.  Skin: Skin is warm and dry.  Psychiatric: He has a normal mood and affect. His speech is normal.  Nursing note and vitals reviewed.    ED Treatments / Results  Labs (all labs ordered are listed, but only abnormal results are displayed) Labs Reviewed - No data to display  EKG  EKG Interpretation None       Radiology No results found.  Procedures Procedures (including critical care time)  Medications Ordered in ED Medications  fluorescein ophthalmic strip 1 strip (not administered)  tetracaine (PONTOCAINE) 0.5 % ophthalmic solution 2 drop (not administered)     Initial Impression / Assessment and Plan / ED Course  I have reviewed the triage vital signs and the nursing notes.  Pertinent labs & imaging results that were available during my care of the patient were reviewed by me and considered in my medical decision making (see chart for details).  Clinical Course    **I have reviewed nursing notes, vital signs, and all appropriate lab and imaging results for this patient.*  Final Clinical Impressions(s) / ED Diagnoses  The pH of the right eye is nearly 8.  The right eye was irrigated with Lequita HaltMorgan lens on. Yellowish mucus is noted in the corner of the right eye as well as in the eyelashes. The  conjunctiva is injected. Suspect conjunctivitis. No corneal burn ulcer or foreign body appreciated.  Patient is treated with tobramycin ophthalmic drops. He will use dark eyeglasses, and a hat with brim to protect from excess light changes. Patient is in agreement with this discharge plan.    Final diagnoses:  Bilateral conjunctivitis    New Prescriptions New Prescriptions   No medications on file     Ivery QualeHobson Ashleynicole Mcclees, PA-C 06/08/16 1134    Samuel JesterKathleen McManus, DO 06/11/16 1459

## 2016-06-07 NOTE — ED Triage Notes (Signed)
Pt comes in to ED today with notable R. Eye inflammation, redness, and drainage. Per pt weedkiller got into the eye approx 1 hour ago. States it is difficult to see feels like vision "comes in and out". Pt attemepted to flush eye with water for approx 4 minutes with no relief. Pt AOx4.

## 2016-06-07 NOTE — ED Notes (Signed)
PA Beverely PaceBryant notified 1L irrigation is complete.

## 2016-06-13 ENCOUNTER — Emergency Department (HOSPITAL_COMMUNITY): Payer: BLUE CROSS/BLUE SHIELD

## 2016-06-13 ENCOUNTER — Emergency Department (HOSPITAL_COMMUNITY)
Admission: EM | Admit: 2016-06-13 | Discharge: 2016-06-14 | Disposition: A | Discharge status: Home / Self Care | Payer: BLUE CROSS/BLUE SHIELD | Attending: Emergency Medicine | Admitting: Emergency Medicine

## 2016-06-13 ENCOUNTER — Encounter (HOSPITAL_COMMUNITY): Payer: Self-pay

## 2016-06-13 DIAGNOSIS — M5136 Other intervertebral disc degeneration, lumbar region: Secondary | ICD-10-CM | POA: Diagnosis not present

## 2016-06-13 DIAGNOSIS — M5441 Lumbago with sciatica, right side: Secondary | ICD-10-CM | POA: Diagnosis not present

## 2016-06-13 DIAGNOSIS — M5431 Sciatica, right side: Secondary | ICD-10-CM

## 2016-06-13 DIAGNOSIS — M545 Low back pain: Secondary | ICD-10-CM | POA: Diagnosis present

## 2016-06-13 DIAGNOSIS — F17210 Nicotine dependence, cigarettes, uncomplicated: Secondary | ICD-10-CM | POA: Insufficient documentation

## 2016-06-13 MED ORDER — METHYLPREDNISOLONE SODIUM SUCC 125 MG IJ SOLR
125.0000 mg | Freq: Once | INTRAMUSCULAR | Status: AC
  Administered 2016-06-14: 125 mg via INTRAMUSCULAR
  Filled 2016-06-13: qty 2

## 2016-06-13 MED ORDER — HYDROCODONE-ACETAMINOPHEN 5-325 MG PO TABS
2.0000 | ORAL_TABLET | Freq: Once | ORAL | Status: AC
  Administered 2016-06-14: 2 via ORAL
  Filled 2016-06-13: qty 2

## 2016-06-13 MED ORDER — DIAZEPAM 5 MG PO TABS
10.0000 mg | ORAL_TABLET | Freq: Once | ORAL | Status: AC
  Administered 2016-06-14: 10 mg via ORAL
  Filled 2016-06-13: qty 2

## 2016-06-13 MED ORDER — KETOROLAC TROMETHAMINE 10 MG PO TABS
10.0000 mg | ORAL_TABLET | Freq: Once | ORAL | Status: AC
  Administered 2016-06-14: 10 mg via ORAL
  Filled 2016-06-13: qty 1

## 2016-06-13 NOTE — ED Triage Notes (Signed)
Patient states he was getting out of bed, and felt a pain in his right lower back that radiates into right leg.

## 2016-06-14 MED ORDER — DICLOFENAC SODIUM 75 MG PO TBEC
75.0000 mg | DELAYED_RELEASE_TABLET | Freq: Two times a day (BID) | ORAL | 0 refills | Status: DC
Start: 2016-06-14 — End: 2019-03-14

## 2016-06-14 MED ORDER — DEXAMETHASONE 4 MG PO TABS: 4.0000 mg | ORAL_TABLET | Freq: Two times a day (BID) | ORAL | 0 refills | Status: DC

## 2016-06-14 MED ORDER — PROMETHAZINE HCL 12.5 MG PO TABS
12.5000 mg | ORAL_TABLET | Freq: Once | ORAL | Status: AC
  Administered 2016-06-14: 12.5 mg via ORAL
  Filled 2016-06-14: qty 1

## 2016-06-14 MED ORDER — MORPHINE SULFATE (PF) 4 MG/ML IV SOLN
8.0000 mg | Freq: Once | INTRAVENOUS | Status: AC
  Administered 2016-06-14: 8 mg via INTRAMUSCULAR
  Filled 2016-06-14: qty 2

## 2016-06-14 MED ORDER — CYCLOBENZAPRINE HCL 10 MG PO TABS: 10.0000 mg | ORAL_TABLET | Freq: Three times a day (TID) | ORAL | 0 refills | Status: DC

## 2016-06-14 MED ORDER — HYDROCODONE-ACETAMINOPHEN 5-325 MG PO TABS: 1.0000 | ORAL_TABLET | ORAL | 0 refills | Status: DC | PRN

## 2016-06-14 NOTE — Discharge Instructions (Signed)
Your vital signs within normal limits. Your x-ray suggest a lower lumbar degenerative disc disease, from L4 to your sacral on. Please see Dr.Swinteck concerning your lower back disease as soon as possible. Please use crutches for added support. Heating pad to your lower back while also be helpful. Please use Flexeril 3 times daily for spasm pain. Use Decadron and diclofenac 2 times daily with food. Use Norco for more severe pain. Norco and Flexeril may cause drowsiness, please use these medications with caution.

## 2016-06-15 NOTE — ED Provider Notes (Signed)
[facility] Provider Note   CSN: [id number] Arrival date & time: 06/13/16  2225     History   Chief Complaint Chief Complaint  Patient presents with  . Leg Pain  . Back Pain    HPI Timothy Dalton is a 50 y.o. male.  Patient is a 50 year old male who presents to the emergency department with a complaint of lower back pain.  The patient states that earlier tonight he was getting out of bed, and felt a pop in a pole in his lower back. He says from this point the pain radiated down his right leg. He has had difficulty with walking and having pain with even slight movement since that time. The patient denies any previous operations or procedures involving his back. He's had no loss of bowel or bladder function recently. No frequent falls. No recent injury or trauma to be reported. He presents now for assistance with his pain and for evaluation concerning his back.      Past Medical History:  Diagnosis Date  . Gallstone     Patient Active Problem List   Diagnosis Date Noted  . Pancreatitis, acute 04/22/2016  . Abdominal pain, epigastric 04/22/2016  . Gallstones 04/22/2016  . Abnormal LFTs 04/22/2016  . Acute pancreatitis 04/22/2016  . Gallstone pancreatitis     Past Surgical History:  Procedure Laterality Date  . CHOLECYSTECTOMY N/A 04/30/2016   Procedure: LAPAROSCOPIC CHOLECYSTECTOMY;  Surgeon: Ancil LinseyJason Evan Davis, MD;  Location: [facility];  Service: General;  Laterality: N/A;  . NO PAST SURGERIES         Home Medications    Prior to Admission medications   Medication Sig Start Date End Date Taking? Authorizing Provider  cyclobenzaprine (FLEXERIL) 10 MG tablet Take 1 tablet (10 mg total) by mouth 3 (three) times daily. 06/14/16   Ivery QualeHobson Marcellene Shivley, PA-C  dexamethasone (DECADRON) 4 MG tablet Take 1 tablet (4 mg total) by mouth 2 (two) times daily with a meal. 06/14/16   Ivery QualeHobson Jalysa Swopes, PA-C  diclofenac (VOLTAREN) 75 MG EC tablet Take 1 tablet (75 mg total) by mouth 2  (two) times daily. 06/14/16   Ivery QualeHobson Lailana Shira, PA-C  HYDROcodone-acetaminophen (NORCO/VICODIN) 5-325 MG tablet Take 1 tablet by mouth every 4 (four) hours as needed. 06/14/16   Ivery QualeHobson Wentworth Edelen, PA-C  oxyCODONE-acetaminophen (ROXICET) 5-325 MG tablet Take 1-2 tablets by mouth every 4 (four) hours as needed for severe pain. Patient not taking: Reported on 06/07/2016 04/30/16   Ancil LinseyJason Evan Davis, MD    Family History Family History  Problem Relation Age of Onset  . Colon cancer Neg Hx   . Pancreatic disease Neg Hx   . Liver disease Neg Hx     Social History Social History  Substance Use Topics  . Smoking status: Current Every Day Smoker    Packs/day: 1.00    Types: Cigarettes  . Smokeless tobacco: Never Used  . Alcohol use 0.0 oz/week     Comment: occ, 1-2 12 ounce beers about once a week (04/23/16)     Allergies   Review of patient's allergies indicates no known allergies.   Review of Systems Review of Systems  Constitutional: Negative for activity change.       All ROS Neg except as noted in HPI  HENT: Negative for nosebleeds.   Eyes: Negative for photophobia and discharge.  Respiratory: Negative for cough, shortness of breath and wheezing.   Cardiovascular: Negative for chest pain and palpitations.  Gastrointestinal: Negative for abdominal pain and blood in  stool.  Genitourinary: Negative for dysuria, frequency and hematuria.  Musculoskeletal: Positive for back pain. Negative for arthralgias and neck pain.  Skin: Negative.   Neurological: Negative for dizziness, seizures and speech difficulty.  Psychiatric/Behavioral: Negative for confusion and hallucinations.     Physical Exam Updated Vital Signs BP 128/81 (BP Location: Left Arm)   Pulse 96   Temp 97.9 F (36.6 C) (Oral)   Resp 20   Ht 5\' 11"  (1.803 m)   Wt (!) 142.9 kg   SpO2 100%   BMI 43.93 kg/m   Physical Exam  Constitutional: He is oriented to person, place, and time. He appears well-developed and  well-nourished.  Non-toxic appearance.  HENT:  Head: Normocephalic.  Right Ear: Tympanic membrane and external ear normal.  Left Ear: Tympanic membrane and external ear normal.  Eyes: EOM and lids are normal. Pupils are equal, round, and reactive to light.  Neck: Normal range of motion. Neck supple. Carotid bruit is not present.  Cardiovascular: Normal rate, regular rhythm, normal heart sounds, intact distal pulses and normal pulses.   Pulmonary/Chest: Breath sounds normal. No respiratory distress.  Abdominal: Soft. Bowel sounds are normal. There is no tenderness. There is no guarding.  Musculoskeletal: Normal range of motion.  There is pain to palpation of the lumbar spine and the lower portion. There is paraspinal tenderness on the right greater than on the left. No palpable step off of the lumbar spine. Patient has severe pain with even minimal attempted straight leg raise on the right.  Lymphadenopathy:       Head (right side): No submandibular adenopathy present.       Head (left side): No submandibular adenopathy present.    He has no cervical adenopathy.  Neurological: He is alert and oriented to person, place, and time. He has normal strength. No cranial nerve deficit or sensory deficit.  Skin: Skin is warm and dry.  Psychiatric: He has a normal mood and affect. His speech is normal.  Nursing note and vitals reviewed.    ED Treatments / Results  Labs (all labs ordered are listed, but only abnormal results are displayed) Labs Reviewed - No data to display  EKG  EKG Interpretation None       Radiology Dg Lumbar Spine Complete  Result Date: 06/14/2016 CLINICAL DATA:  Nontraumatic right lower back pain upon getting out of bed. EXAM: LUMBAR SPINE - COMPLETE 4+ VIEW COMPARISON:  None. FINDINGS: The lumbar vertebrae are normal in height. No fracture or other acute bony abnormality is evident. Mild to moderate lumbar degenerative disc changes are present from L4 through the  sacrum. There is no spondylolysis or spondylolisthesis. Sacroiliac joints appear unremarkable. IMPRESSION: Lower lumbar degenerative disc disease. Electronically Signed   By: Ellery Plunk M.D.   On: 06/14/2016 01:16    Procedures Procedures (including critical care time)  Medications Ordered in ED Medications  diazepam (VALIUM) tablet 10 mg (10 mg Oral Given 06/14/16 0004)  ketorolac (TORADOL) tablet 10 mg (10 mg Oral Given 06/14/16 0004)  methylPREDNISolone sodium succinate (SOLU-MEDROL) 125 mg/2 mL injection 125 mg (125 mg Intramuscular Given 06/14/16 0004)  HYDROcodone-acetaminophen (NORCO/VICODIN) 5-325 MG per tablet 2 tablet (2 tablets Oral Given 06/14/16 0004)  morphine 4 MG/ML injection 8 mg (8 mg Intramuscular Given 06/14/16 0135)  promethazine (PHENERGAN) tablet 12.5 mg (12.5 mg Oral Given 06/14/16 0134)     Initial Impression / Assessment and Plan / ED Course  I have reviewed the triage vital signs and the nursing notes.  Pertinent labs & imaging results that were available during my care of the patient were reviewed by me and considered in my medical decision making (see chart for details).  Clinical Course    *I have reviewed nursing notes, vital signs, and all appropriate lab and imaging results for this patient.**  Final Clinical Impressions(s) / ED Diagnoses Vital signs reviewed. X-ray of the lumbar spine reveals degenerative disc disease from L4 into the sacrum.  I discussed these findings with the patient. The patient has extreme problem getting off the bed and into the wheelchair. Additional pain medication given with significant improvement and the patient getting to his car. Prescription for Flexeril, Decadron, and diclofenac given to the patient. The patient will follow-up with orthopedics as soon as possible.    Final diagnoses:  Lumbar degenerative disc disease  Sciatica of right side    New Prescriptions Discharge Medication List as of 06/14/2016  1:45 AM      START taking these medications   Details  cyclobenzaprine (FLEXERIL) 10 MG tablet Take 1 tablet (10 mg total) by mouth 3 (three) times daily., Starting Mon 06/14/2016, Print    dexamethasone (DECADRON) 4 MG tablet Take 1 tablet (4 mg total) by mouth 2 (two) times daily with a meal., Starting Mon 06/14/2016, Print    diclofenac (VOLTAREN) 75 MG EC tablet Take 1 tablet (75 mg total) by mouth 2 (two) times daily., Starting Mon 06/14/2016, Print    HYDROcodone-acetaminophen (NORCO/VICODIN) 5-325 MG tablet Take 1 tablet by mouth every 4 (four) hours as needed., Starting Mon 06/14/2016, Print         Ivery Quale, PA-C 06/15/16 2219    Devoria Albe, MD 06/25/16 2302

## 2016-06-24 ENCOUNTER — Telehealth: Payer: Self-pay | Admitting: Gastroenterology

## 2016-06-24 ENCOUNTER — Ambulatory Visit: Payer: BLUE CROSS/BLUE SHIELD | Admitting: Gastroenterology

## 2016-06-24 ENCOUNTER — Encounter: Payer: Self-pay | Admitting: Gastroenterology

## 2016-06-24 NOTE — Telephone Encounter (Signed)
PT WAS A NO SHOW AND LETTER SENT  °

## 2016-06-30 ENCOUNTER — Other Ambulatory Visit: Payer: Self-pay | Admitting: Physical Medicine and Rehabilitation

## 2016-06-30 DIAGNOSIS — M51369 Other intervertebral disc degeneration, lumbar region without mention of lumbar back pain or lower extremity pain: Secondary | ICD-10-CM

## 2016-06-30 DIAGNOSIS — M5136 Other intervertebral disc degeneration, lumbar region: Secondary | ICD-10-CM

## 2016-07-02 ENCOUNTER — Ambulatory Visit
Admission: RE | Admit: 2016-07-02 | Discharge: 2016-07-02 | Disposition: A | Discharge status: Home / Self Care | Payer: BLUE CROSS/BLUE SHIELD | Source: Ambulatory Visit | Attending: Physical Medicine and Rehabilitation | Admitting: Physical Medicine and Rehabilitation

## 2016-07-02 DIAGNOSIS — M5136 Other intervertebral disc degeneration, lumbar region: Secondary | ICD-10-CM

## 2016-07-26 ENCOUNTER — Telehealth: Payer: Self-pay | Admitting: Gastroenterology

## 2016-07-26 ENCOUNTER — Ambulatory Visit: Payer: BLUE CROSS/BLUE SHIELD | Admitting: Gastroenterology

## 2016-07-26 ENCOUNTER — Encounter: Payer: Self-pay | Admitting: Gastroenterology

## 2016-07-26 NOTE — Telephone Encounter (Signed)
Letter mailed

## 2016-07-26 NOTE — Telephone Encounter (Signed)
Pt was a no show

## 2016-10-19 ENCOUNTER — Other Ambulatory Visit (HOSPITAL_BASED_OUTPATIENT_CLINIC_OR_DEPARTMENT_OTHER): Payer: Self-pay

## 2016-10-19 DIAGNOSIS — R0683 Snoring: Secondary | ICD-10-CM

## 2016-11-24 ENCOUNTER — Encounter (HOSPITAL_BASED_OUTPATIENT_CLINIC_OR_DEPARTMENT_OTHER): Payer: BLUE CROSS/BLUE SHIELD

## 2017-06-26 IMAGING — DX DG ABDOMEN ACUTE W/ 1V CHEST
4 series · 4 of 4 positions shown · non-contrast
Comparison: Chest radiograph 05/08/2013. Abdominal ultrasound
05/08/2013.

CLINICAL DATA: Abdominal pain with gallstones.

EXAM:
DG ABDOMEN ACUTE W/ 1V CHEST

[chest pa]
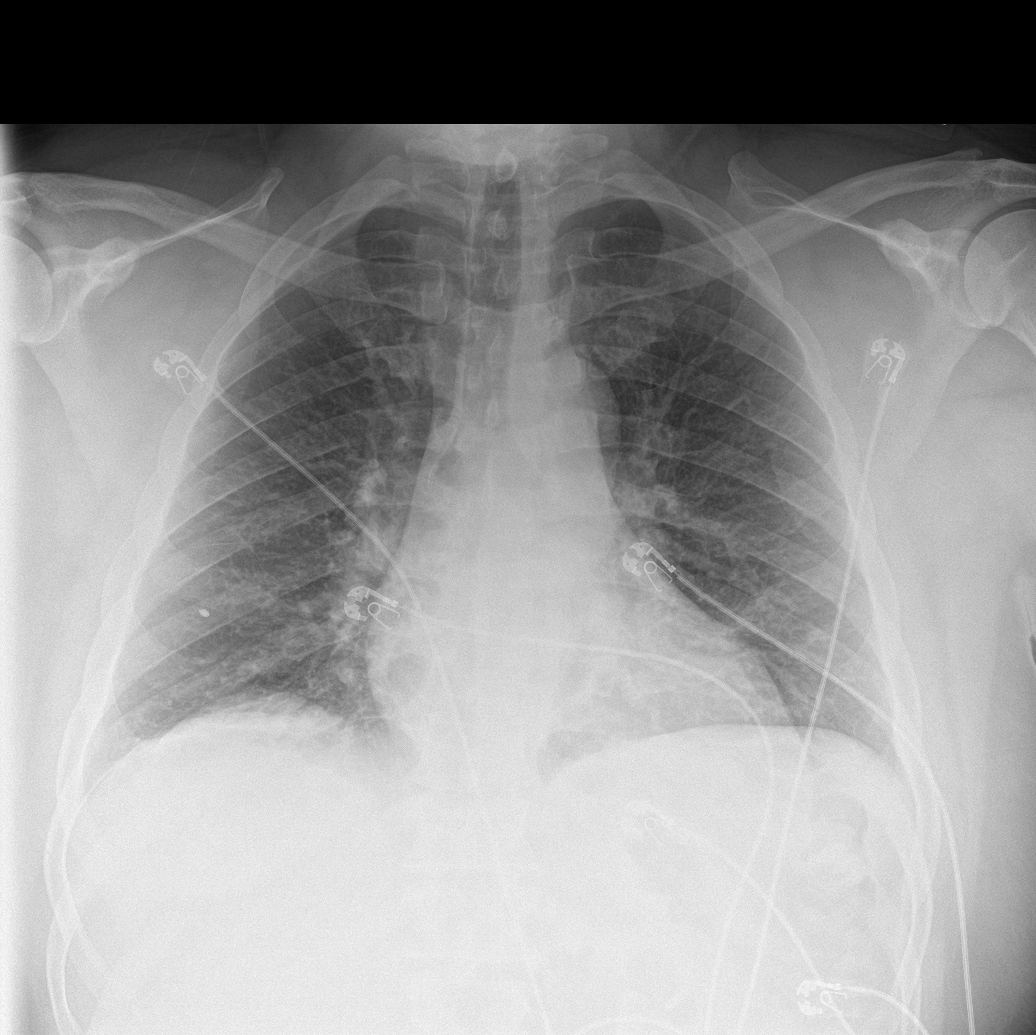

[abdomen erect]
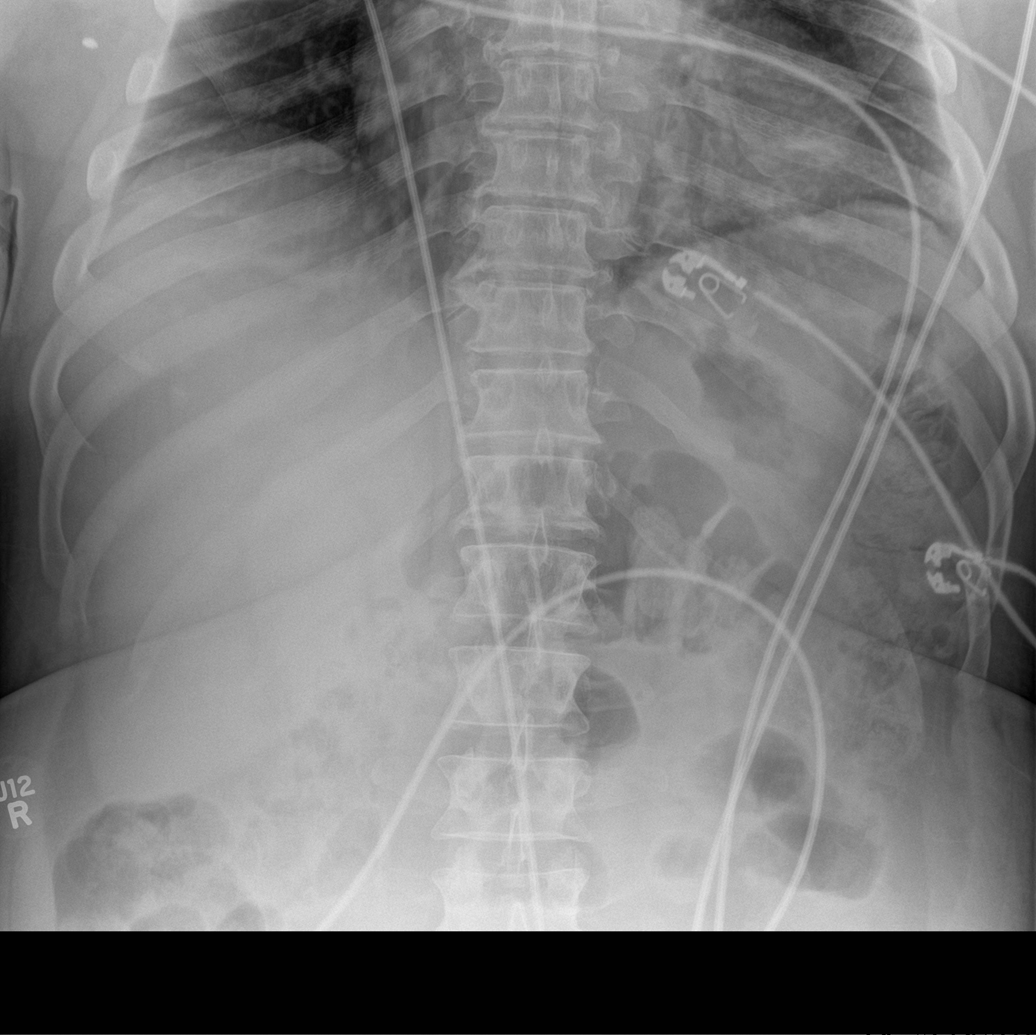

[abdomen supine (1 of 2)]
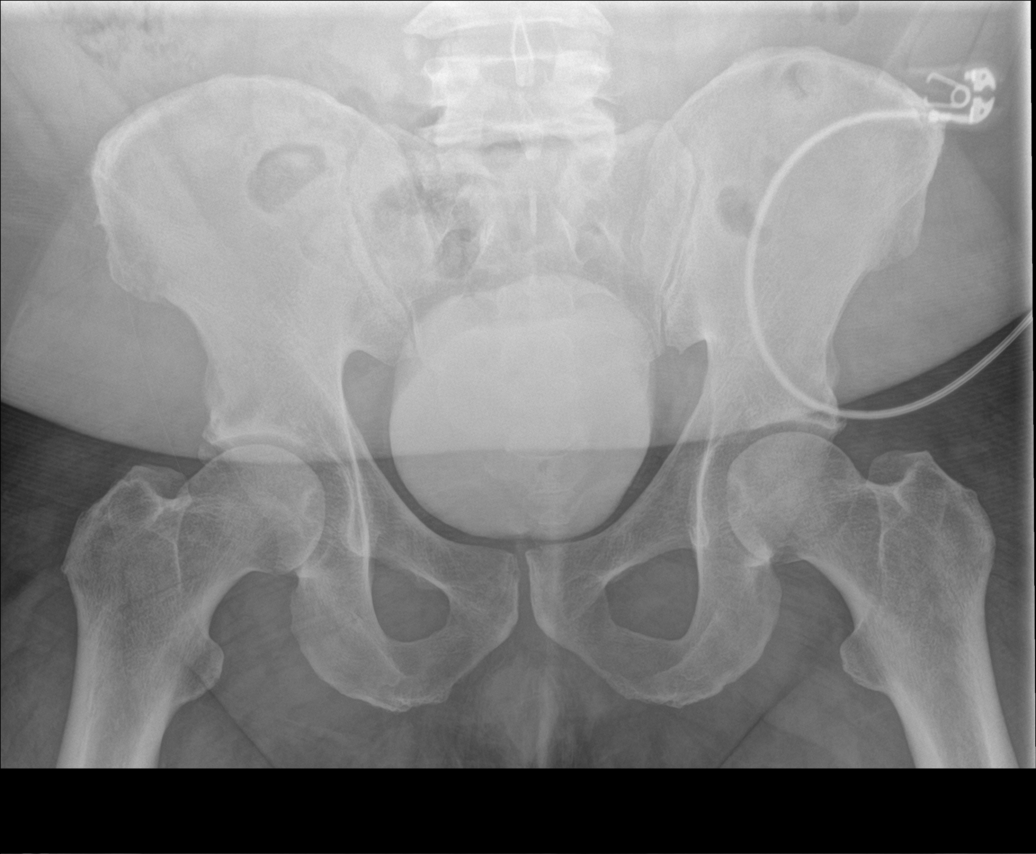

[abdomen supine (2 of 2)]
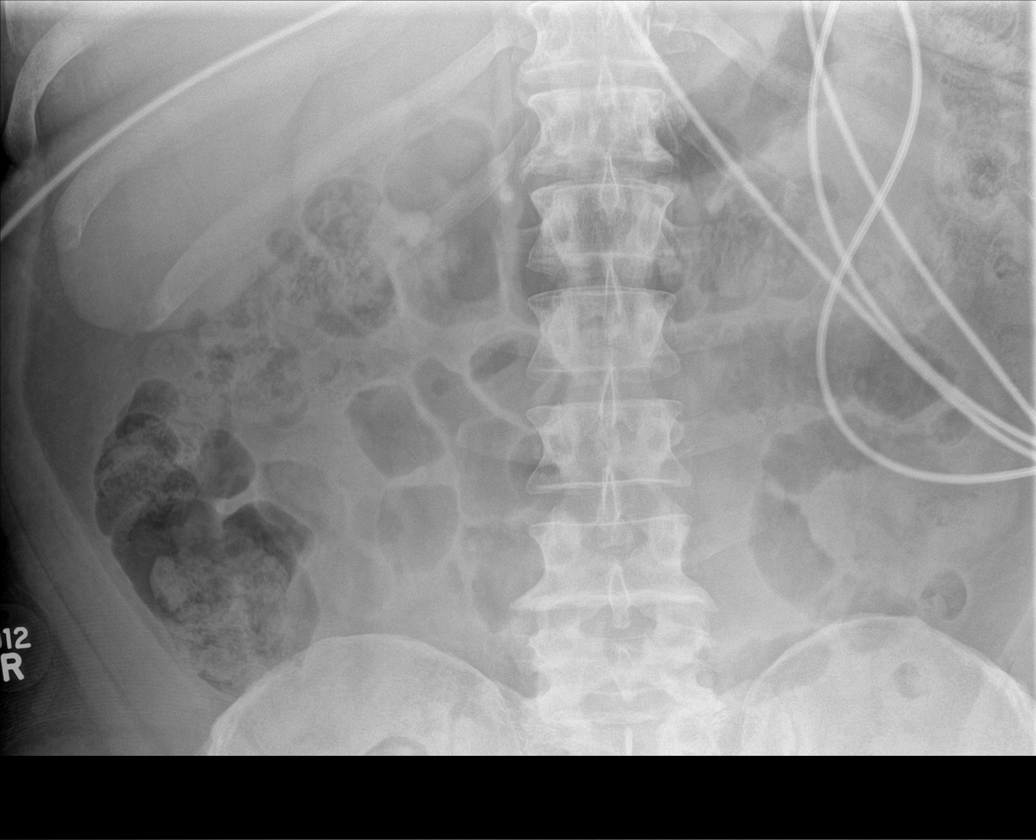

[4 of 4 positions shown; findings below may reference images not displayed]

FINDINGS: Frontal view of the chest demonstrates midline trachea. Borderline
cardiomegaly. Mediastinal contours otherwise within normal limits.
No pleural effusion or pneumothorax. Mild lower lobe predominant
interstitial thickening. Probable calcified granuloma at the right
lung base. No lobar consolidation.

Abdominal films demonstrate no free intraperitoneal air or
significant air-fluid levels on upright positioning. Contrast filled
urinary bladder. No gaseous distention of bowel loops on supine
imaging. Moderate amount of colonic stool.
IMPRESSION: No acute findings.

Chronic pulmonary interstitial thickening, likely related to the
clinical history of smoking.

Contrast filled bladder.

## 2018-03-05 ENCOUNTER — Emergency Department (HOSPITAL_COMMUNITY)
Admission: EM | Admit: 2018-03-05 | Discharge: 2018-03-05 | Disposition: A | Discharge status: Home / Self Care | Payer: BLUE CROSS/BLUE SHIELD | Attending: Emergency Medicine | Admitting: Emergency Medicine

## 2018-03-05 ENCOUNTER — Encounter (HOSPITAL_COMMUNITY): Payer: Self-pay

## 2018-03-05 DIAGNOSIS — F17210 Nicotine dependence, cigarettes, uncomplicated: Secondary | ICD-10-CM | POA: Diagnosis not present

## 2018-03-05 DIAGNOSIS — Z79899 Other long term (current) drug therapy: Secondary | ICD-10-CM | POA: Diagnosis not present

## 2018-03-05 DIAGNOSIS — Y998 Other external cause status: Secondary | ICD-10-CM | POA: Insufficient documentation

## 2018-03-05 DIAGNOSIS — Y929 Unspecified place or not applicable: Secondary | ICD-10-CM | POA: Diagnosis not present

## 2018-03-05 DIAGNOSIS — Y9389 Activity, other specified: Secondary | ICD-10-CM | POA: Diagnosis not present

## 2018-03-05 DIAGNOSIS — S30861A Insect bite (nonvenomous) of abdominal wall, initial encounter: Secondary | ICD-10-CM | POA: Insufficient documentation

## 2018-03-05 DIAGNOSIS — I10 Essential (primary) hypertension: Secondary | ICD-10-CM | POA: Diagnosis not present

## 2018-03-05 DIAGNOSIS — W57XXXA Bitten or stung by nonvenomous insect and other nonvenomous arthropods, initial encounter: Secondary | ICD-10-CM | POA: Diagnosis not present

## 2018-03-05 MED ORDER — DOXYCYCLINE HYCLATE 100 MG PO CAPS: 100.0000 mg | ORAL_CAPSULE | Freq: Two times a day (BID) | ORAL | 0 refills | Status: DC

## 2018-03-05 NOTE — ED Triage Notes (Signed)
Pt found a tick in his navel last night. Tick is still present

## 2018-03-05 NOTE — Discharge Instructions (Signed)
Clean the area with mild soap and water.  You may apply over-the-counter 1% hydrocortisone cream 2-3 times a day if needed for itching.  Your blood pressure today is elevated.  You need to establish primary care to arrange follow-up regarding your blood pressure.  Return to the ER for any worsening symptoms such as fever, vomiting, rash or joint pain

## 2018-03-05 NOTE — ED Provider Notes (Signed)
[facility] EMERGENCY DEPARTMENT Provider Note   CSN: [id number] Arrival date & time: 03/05/18  7829     History   Chief Complaint Chief Complaint  Patient presents with  . Tick Removal    HPI Timothy Dalton is a 52 y.o. male.  HPI   Timothy Dalton is a 52 y.o. male who presents to the Emergency Department requesting removal of a tick to his umbilicus.  He states that he noticed the tick last evening.  He did not attempt to remove the tick.  He denies any symptoms at this time.  He is unsure how long the tick has been attached.  No history of previous tick related illness   Past Medical History:  Diagnosis Date  . Gallstone     Patient Active Problem List   Diagnosis Date Noted  . Pancreatitis, acute 04/22/2016  . Abdominal pain, epigastric 04/22/2016  . Gallstones 04/22/2016  . Abnormal LFTs 04/22/2016  . Acute pancreatitis 04/22/2016  . Gallstone pancreatitis     Past Surgical History:  Procedure Laterality Date  . CHOLECYSTECTOMY N/A 04/30/2016   Procedure: LAPAROSCOPIC CHOLECYSTECTOMY;  Surgeon: Ancil Linsey, MD;  Location: [facility];  Service: General;  Laterality: N/A;  . NO PAST SURGERIES          Home Medications    Prior to Admission medications   Medication Sig Start Date End Date Taking? Authorizing Provider  cyclobenzaprine (FLEXERIL) 10 MG tablet Take 1 tablet (10 mg total) by mouth 3 (three) times daily. 06/14/16   Ivery Quale, PA-C  dexamethasone (DECADRON) 4 MG tablet Take 1 tablet (4 mg total) by mouth 2 (two) times daily with a meal. 06/14/16   Ivery Quale, PA-C  diclofenac (VOLTAREN) 75 MG EC tablet Take 1 tablet (75 mg total) by mouth 2 (two) times daily. 06/14/16   Ivery Quale, PA-C  HYDROcodone-acetaminophen (NORCO/VICODIN) 5-325 MG tablet Take 1 tablet by mouth every 4 (four) hours as needed. 06/14/16   Ivery Quale, PA-C  oxyCODONE-acetaminophen (ROXICET) 5-325 MG tablet Take 1-2 tablets by mouth every 4 (four) hours as needed  for severe pain. Patient not taking: Reported on 06/07/2016 04/30/16   Ancil Linsey, MD    Family History Family History  Problem Relation Age of Onset  . Colon cancer Neg Hx   . Pancreatic disease Neg Hx   . Liver disease Neg Hx     Social History Social History   Tobacco Use  . Smoking status: Current Every Day Smoker    Packs/day: 1.00    Types: Cigarettes  . Smokeless tobacco: Never Used  Substance Use Topics  . Alcohol use: Yes    Alcohol/week: 0.0 oz    Comment: occ, 1-2 12 ounce beers about once a week (04/23/16)  . Drug use: No     Allergies   Patient has no known allergies.   Review of Systems Review of Systems  Constitutional: Negative for activity change, appetite change, chills and fever.  Respiratory: Negative for shortness of breath.   Gastrointestinal: Negative for nausea and vomiting.  Musculoskeletal: Negative for arthralgias and myalgias.  Skin: Negative for rash and wound.       Tick bite   Neurological: Negative for dizziness and headaches.  Hematological: Negative for adenopathy.  All other systems reviewed and are negative.    Physical Exam Updated Vital Signs BP (!) 155/102 (BP Location: Left Arm)   Pulse 85   Temp 98.2 F (36.8 C) (Oral)   Resp 16  Wt (!) 142.9 kg (315 lb)   SpO2 96%   BMI 43.93 kg/m   Physical Exam  Constitutional: He is oriented to person, place, and time. He appears well-nourished. No distress.  HENT:  Head: Atraumatic.  Cardiovascular: Normal rate and regular rhythm.  Pulmonary/Chest: Effort normal. No respiratory distress.  Abdominal: Soft. He exhibits no distension. There is no tenderness.  Musculoskeletal: Normal range of motion.  Neurological: He is alert and oriented to person, place, and time.  Skin: Skin is warm. No rash noted.  Tick attached to the skin of the umbilicus.  No edema or surrounding erythema.  No skin rashes  Psychiatric: He has a normal mood and affect.  Nursing note and vitals  reviewed.    ED Treatments / Results  Labs (all labs ordered are listed, but only abnormal results are displayed) Labs Reviewed - No data to display  EKG None  Radiology No results found.  Procedures Procedures (including critical care time)    Foreign body removal Skin of the umbilicus was cleaned by me using alcohol pads, tick was removed completely using forceps.  No complications.  he tolerated procedure well.     Medications Ordered in ED Medications - No data to display   Initial Impression / Assessment and Plan / ED Course  I have reviewed the triage vital signs and the nursing notes.  Pertinent labs & imaging results that were available during my care of the patient were reviewed by me and considered in my medical decision making (see chart for details).     Well-appearing.  Presented to the emergency department for tick removal.  No symptoms.  Requesting doxycycline.  Patient agrees to PCP follow-up if needed.  Patient is hypertensive, without symptoms, finding discussed with patient.  He agrees to establish PCP.  Given referral information for the local clinic.  Final Clinical Impressions(s) / ED Diagnoses   Final diagnoses:  Tick bite, initial encounter  Hypertension, unspecified type    ED Discharge Orders    None       Pauline Aus, PA-C 03/05/18 4782    Donnetta Hutching, MD 03/05/18 1452

## 2019-03-12 ENCOUNTER — Other Ambulatory Visit: Payer: Self-pay

## 2019-03-12 ENCOUNTER — Emergency Department (HOSPITAL_COMMUNITY)
Admission: EM | Admit: 2019-03-12 | Discharge: 2019-03-13 | Disposition: A | Discharge status: Home / Self Care | Payer: BLUE CROSS/BLUE SHIELD | Attending: Emergency Medicine | Admitting: Emergency Medicine

## 2019-03-12 ENCOUNTER — Encounter (HOSPITAL_COMMUNITY): Payer: Self-pay | Admitting: *Deleted

## 2019-03-12 DIAGNOSIS — R42 Dizziness and giddiness: Secondary | ICD-10-CM | POA: Diagnosis present

## 2019-03-12 DIAGNOSIS — F17210 Nicotine dependence, cigarettes, uncomplicated: Secondary | ICD-10-CM | POA: Diagnosis not present

## 2019-03-12 DIAGNOSIS — K625 Hemorrhage of anus and rectum: Secondary | ICD-10-CM | POA: Insufficient documentation

## 2019-03-12 DIAGNOSIS — Z79899 Other long term (current) drug therapy: Secondary | ICD-10-CM | POA: Diagnosis not present

## 2019-03-12 LAB — BASIC METABOLIC PANEL
Anion gap: 9 (ref 5–15)
BUN: 11 mg/dL (ref 6–20)
CO2: 25 mmol/L (ref 22–32)
Calcium: 8.8 mg/dL — ABNORMAL LOW (ref 8.9–10.3)
Chloride: 107 mmol/L (ref 98–111)
Creatinine, Ser: 0.9 mg/dL (ref 0.61–1.24)
GFR calc Af Amer: >60 mL/min (ref >60)
GFR calc non Af Amer: >60 mL/min (ref >60)
Glucose, Bld: 120 mg/dL — ABNORMAL HIGH (ref 70–99)
Potassium: 3.5 mmol/L (ref 3.5–5.1)
Sodium: 141 mmol/L (ref 135–145)

## 2019-03-12 LAB — CBC WITH DIFFERENTIAL/PLATELET
Abs Immature Granulocytes: 0.04 10*3/uL (ref 0.00–0.07)
Basophils Absolute: 0.1 10*3/uL (ref 0.0–0.1)
Basophils Relative: 1 %
Eosinophils Absolute: 0.2 10*3/uL (ref 0.0–0.5)
Eosinophils Relative: 2 %
HCT: 44.9 % (ref 39.0–52.0)
Hemoglobin: 14.3 g/dL (ref 13.0–17.0)
Immature Granulocytes: 0 %
Lymphocytes Relative: 44 %
Lymphs Abs: 4.3 10*3/uL — ABNORMAL HIGH (ref 0.7–4.0)
MCH: 28.1 pg (ref 26.0–34.0)
MCHC: 31.8 g/dL (ref 30.0–36.0)
MCV: 88.2 fL (ref 80.0–100.0)
Monocytes Absolute: 0.6 10*3/uL (ref 0.1–1.0)
Monocytes Relative: 6 %
Neutro Abs: 4.6 10*3/uL (ref 1.7–7.7)
Neutrophils Relative %: 47 %
Platelets: 284 10*3/uL (ref 150–400)
RBC: 5.09 MIL/uL (ref 4.22–5.81)
RDW: 16.3 % — ABNORMAL HIGH (ref 11.5–15.5)
WBC: 9.8 10*3/uL (ref 4.0–10.5)
nRBC: 0 % (ref 0.0–0.2)

## 2019-03-12 NOTE — ED Triage Notes (Signed)
Pt with HA off and on for awhile, dizziness off and on for a week but has gotten worse tonight.

## 2019-03-12 NOTE — ED Notes (Signed)
Pt brought back to room. Denies headache and dizziness

## 2019-03-13 LAB — POC OCCULT BLOOD, ED: Fecal Occult Bld: POSITIVE — AB

## 2019-03-13 MED ORDER — LISINOPRIL 20 MG PO TABS: 20.0000 mg | ORAL_TABLET | Freq: Every day | ORAL | 0 refills | Status: DC

## 2019-03-13 NOTE — ED Notes (Signed)
edp in room  

## 2019-03-13 NOTE — Discharge Instructions (Addendum)
Follow-up with Dr. Darrick Penna for your blood in your stool.  Follow-up with your family doctor for your blood pressure

## 2019-03-13 NOTE — ED Provider Notes (Signed)
[facility] EMERGENCY DEPARTMENT Provider Note   CSN: [id number] Arrival date & time: 03/12/19  2115    History   Chief Complaint Chief Complaint  Patient presents with  . Dizziness    HA off and on    HPI Timothy Dalton is a 53 y.o. male.     Patient complains of dizziness and rectal bleeding.  Patient does not have a doctor  The history is provided by the patient. No language interpreter was used.  Dizziness  Quality:  Lightheadedness Severity:  Mild Onset quality:  Sudden Timing:  Constant Progression:  Partially resolved Chronicity:  New Context: not when bending over   Relieved by:  Nothing Worsened by:  Nothing Ineffective treatments:  None tried Associated symptoms: blood in stool   Associated symptoms: no chest pain, no diarrhea and no headaches   Risk factors: no anemia     Past Medical History:  Diagnosis Date  . Gallstone     Patient Active Problem List   Diagnosis Date Noted  . Pancreatitis, acute 04/22/2016  . Abdominal pain, epigastric 04/22/2016  . Gallstones 04/22/2016  . Abnormal LFTs 04/22/2016  . Acute pancreatitis 04/22/2016  . Gallstone pancreatitis     Past Surgical History:  Procedure Laterality Date  . CHOLECYSTECTOMY N/A 04/30/2016   Procedure: LAPAROSCOPIC CHOLECYSTECTOMY;  Surgeon: Ancil Linsey, MD;  Location: [facility];  Service: General;  Laterality: N/A;  . NO PAST SURGERIES          Home Medications    Prior to Admission medications   Medication Sig Start Date End Date Taking? Authorizing Provider  cyclobenzaprine (FLEXERIL) 10 MG tablet Take 1 tablet (10 mg total) by mouth 3 (three) times daily. 06/14/16   Ivery Quale, PA-C  dexamethasone (DECADRON) 4 MG tablet Take 1 tablet (4 mg total) by mouth 2 (two) times daily with a meal. 06/14/16   Ivery Quale, PA-C  diclofenac (VOLTAREN) 75 MG EC tablet Take 1 tablet (75 mg total) by mouth 2 (two) times daily. 06/14/16   Ivery Quale, PA-C  doxycycline (VIBRAMYCIN)  100 MG capsule Take 1 capsule (100 mg total) by mouth 2 (two) times daily. 03/05/18   Triplett, Tammy, PA-C  HYDROcodone-acetaminophen (NORCO/VICODIN) 5-325 MG tablet Take 1 tablet by mouth every 4 (four) hours as needed. 06/14/16   Ivery Quale, PA-C  lisinopril (ZESTRIL) 20 MG tablet Take 1 tablet (20 mg total) by mouth daily. 03/13/19   Bethann Berkshire, MD  oxyCODONE-acetaminophen (ROXICET) 5-325 MG tablet Take 1-2 tablets by mouth every 4 (four) hours as needed for severe pain. Patient not taking: Reported on 06/07/2016 04/30/16   Ancil Linsey, MD    Family History Family History  Problem Relation Age of Onset  . Colon cancer Neg Hx   . Pancreatic disease Neg Hx   . Liver disease Neg Hx     Social History Social History   Tobacco Use  . Smoking status: Current Every Day Smoker    Packs/day: 0.25    Types: Cigarettes  . Smokeless tobacco: Never Used  Substance Use Topics  . Alcohol use: Yes    Alcohol/week: 0.0 standard drinks    Comment: occ, 1-2 12 ounce beers about once a week (04/23/16)  . Drug use: No     Allergies   Patient has no known allergies.   Review of Systems Review of Systems  Constitutional: Negative for appetite change and fatigue.  HENT: Negative for congestion, ear discharge and sinus pressure.   Eyes: Negative  for discharge.  Respiratory: Negative for cough.   Cardiovascular: Negative for chest pain.  Gastrointestinal: Positive for blood in stool. Negative for abdominal pain and diarrhea.  Genitourinary: Negative for frequency and hematuria.  Musculoskeletal: Negative for back pain.  Skin: Negative for rash.  Neurological: Positive for dizziness. Negative for seizures and headaches.  Psychiatric/Behavioral: Negative for hallucinations.  All other systems reviewed and are negative.    Physical Exam Updated Vital Signs BP (!) 158/104   Pulse 74   Resp 20   Ht 6\' 1"  (1.854 m)   Wt (!) 155.1 kg   SpO2 99%   BMI 45.12 kg/m   Physical  Exam Vitals signs and nursing note reviewed.  Constitutional:      Appearance: He is well-developed.  HENT:     Head: Normocephalic.     Nose: Nose normal.  Eyes:     General: No scleral icterus.    Conjunctiva/sclera: Conjunctivae normal.  Neck:     Musculoskeletal: Neck supple.     Thyroid: No thyromegaly.  Cardiovascular:     Rate and Rhythm: Normal rate and regular rhythm.     Heart sounds: No murmur. No friction rub. No gallop.   Pulmonary:     Breath sounds: No stridor. No wheezing or rales.  Chest:     Chest wall: No tenderness.  Abdominal:     General: There is no distension.     Tenderness: There is no abdominal tenderness. There is no rebound.  Musculoskeletal: Normal range of motion.  Lymphadenopathy:     Cervical: No cervical adenopathy.  Skin:    Findings: No erythema or rash.  Neurological:     Mental Status: He is oriented to person, place, and time.     Motor: No abnormal muscle tone.     Coordination: Coordination normal.  Psychiatric:        Behavior: Behavior normal.      ED Treatments / Results  Labs (all labs ordered are listed, but only abnormal results are displayed) Labs Reviewed  CBC WITH DIFFERENTIAL/PLATELET - Abnormal; Notable for the following components:      Result Value   RDW 16.3 (*)    Lymphs Abs 4.3 (*)    All other components within normal limits  BASIC METABOLIC PANEL - Abnormal; Notable for the following components:   Glucose, Bld 120 (*)    Calcium 8.8 (*)    All other components within normal limits  POC OCCULT BLOOD, ED - Abnormal; Notable for the following components:   Fecal Occult Bld POSITIVE (*)    All other components within normal limits  OCCULT BLOOD X 1 CARD TO LAB, STOOL    EKG None  Radiology No results found.  Procedures Procedures (including critical care time)  Medications Ordered in ED Medications - No data to display   Initial Impression / Assessment and Plan / ED Course  I have reviewed  the triage vital signs and the nursing notes.  Pertinent labs & imaging results that were available during my care of the patient were reviewed by me and considered in my medical decision making (see chart for details).        Labs reviewed.  Patient with heme positive stool but normal hemoglobin.  Suspect dizziness is related to blood pressure and he will be started on lisinopril.  He will be referred to GI for the rectal bleeding  Final Clinical Impressions(s) / ED Diagnoses   Final diagnoses:  Dizziness    ED  Discharge Orders         Ordered    lisinopril (ZESTRIL) 20 MG tablet  Daily     03/13/19 0342           Bethann BerkshireZammit, Khaliq Turay, MD 03/13/19 0345

## 2019-03-14 ENCOUNTER — Ambulatory Visit (INDEPENDENT_AMBULATORY_CARE_PROVIDER_SITE_OTHER): Payer: BLUE CROSS/BLUE SHIELD | Admitting: Gastroenterology

## 2019-03-14 ENCOUNTER — Other Ambulatory Visit: Payer: Self-pay

## 2019-03-14 ENCOUNTER — Encounter: Payer: Self-pay | Admitting: Gastroenterology

## 2019-03-14 ENCOUNTER — Telehealth: Payer: Self-pay

## 2019-03-14 DIAGNOSIS — K625 Hemorrhage of anus and rectum: Secondary | ICD-10-CM | POA: Insufficient documentation

## 2019-03-14 MED ORDER — PEG 3350-KCL-NA BICARB-NACL 420 G PO SOLR: 4000.0000 mL | ORAL | 0 refills | Status: DC

## 2019-03-14 MED ORDER — HYDROCORTISONE (PERIANAL) 2.5 % EX CREA: 1.0000 "application " | TOPICAL_CREAM | Freq: Two times a day (BID) | CUTANEOUS | 1 refills | Status: DC

## 2019-03-14 NOTE — Telephone Encounter (Signed)
Pt called office, TCS scheduled for 05/17/19 at 1:45pm. Rx for prep sent to pharmacy. Orders entered.

## 2019-03-14 NOTE — Telephone Encounter (Signed)
Called pt to schedule TCS w/Propofol w/RMR, he isn't available. Male answered and will have him call office.

## 2019-03-14 NOTE — Progress Notes (Signed)
Referring Provider: [facility] Primary Care Physician:  Patient, No Pcp Per  Primary GI: Dr. Jena Gauss  Patient Location: Home   Provider Location: [facility] office   Reason for Visit: Rectal bleeding   Persons present on the virtual encounter, with roles: Patient, Wife, NP   Total time (minutes) spent on medical discussion: 25 minutes   Due to COVID-19, visit was conducted using virtual method.  Visit was requested by patient.  Virtual Visit via Telephone Note Due to COVID-19, visit is conducted virtually and was requested by patient.   I connected with Timothy Dalton on 03/14/19 at  8:00 AM EDT by telephone and verified that I am speaking with the correct person using two identifiers.   I discussed the limitations, risks, security and privacy concerns of performing an evaluation and management service by telephone and the availability of in person appointments. I also discussed with the patient that there may be a patient responsible charge related to this service. The patient expressed understanding and agreed to proceed.  Chief Complaint  Patient presents with  . Rectal Bleeding    went to ED 03/12/19; never had tcs     History of Present Illness: 53 year old male presenting at the request of [facility] due to rectal bleeding. No prior colonoscopy. CBC normal.   Intermittent rectal bleeding for 4-5 months. Occasional constipation. Difficult to quantify. Dark red. Rectal burning, itching. Occasional straining. BM every 2 days. Sometimes hard. Unproductive. No OTC agents. No abdominal pain. No unexplained weight loss or lack of appetite. Takes Ibuprofen about once a week for toothache. Hasn't seen dentist. No fever or chills. No family history of colorectal cancer or polyps.   Occasional reflux. No medications for GERD. Depends on what food he eats. No dysphagia.   He has in advertently taken his son's Xarelto prescription YESTERDAY (one dose). Picked up from pharmacy. Son's  name is the same, and he thought this medication was prescribed for him by ED. I have contacted the pharmacy to verify this was for son and not patient. Patient was instructed to absolutely avoid Xarelto.   Past Medical History:  Diagnosis Date  . Gallstone   . HTN (hypertension)   . Pancreatitis      Past Surgical History:  Procedure Laterality Date  . CHOLECYSTECTOMY N/A 04/30/2016   Procedure: LAPAROSCOPIC CHOLECYSTECTOMY;  Surgeon: Ancil Linsey, MD;  Location: [facility];  Service: General;  Laterality: N/A;     Current Meds  Medication Sig  . folic acid (FOLVITE) 1 MG tablet Take 1 mg by mouth daily.  Marland Kitchen lisinopril (ZESTRIL) 20 MG tablet Take 1 tablet (20 mg total) by mouth daily.     Family History  Problem Relation Age of Onset  . Colon cancer Neg Hx   . Pancreatic disease Neg Hx   . Liver disease Neg Hx   . Colon polyps Neg Hx     Social History   Socioeconomic History  . Marital status: Married    Spouse name: Not on file  . Number of children: Not on file  . Years of education: Not on file  . Highest education level: Not on file  Occupational History  . Not on file  Social Needs  . Financial resource strain: Not on file  . Food insecurity:    Worry: Not on file    Inability: Not on file  . Transportation needs:    Medical: Not on file    Non-medical: Not on file  Tobacco Use  . Smoking status: Current Every Day Smoker    Packs/day: 0.25    Types: Cigarettes  . Smokeless tobacco: Never Used  Substance and Sexual Activity  . Alcohol use: Yes    Alcohol/week: 0.0 standard drinks    Comment: 6 pack on weekends. None during week  . Drug use: No  . Sexual activity: Not on file  Lifestyle  . Physical activity:    Days per week: Not on file    Minutes per session: Not on file  . Stress: Not on file  Relationships  . Social connections:    Talks on phone: Not on file    Gets together: Not on file    Attends religious service: Not on file    Active  member of club or organization: Not on file    Attends meetings of clubs or organizations: Not on file    Relationship status: Not on file  Other Topics Concern  . Not on file  Social History Narrative  . Not on file       Review of Systems: Gen: Denies fever, chills, anorexia. Denies fatigue, weakness, weight loss.  CV: Denies chest pain, palpitations, syncope, peripheral edema, and claudication. Resp: Denies dyspnea at rest, cough, wheezing, coughing up blood, and pleurisy. GI: see HPI Derm: Denies rash, itching, dry skin Psych: Denies depression, anxiety, memory loss, confusion. No homicidal or suicidal ideation.  Heme: see HPI  Observations/Objective: No distress. Video call with patient alert, oriented, laying on bed. Wife present.   Assessment and Plan: [age] year old male with 4-5  month history of rectal bleeding, likely benign anorectal source. CBC normal. Intermittent rectal burning, itching. No prior colonoscopy or family history of colorectal cancer or polyps. Needs diagnostic colonoscopy in the near future.   Proceed with TCS with Dr. Jena Gaussourk in near future: the risks, benefits, and alternatives have been discussed with the patient in detail. The patient states understanding and desires to proceed. Propofol due to history of ETOH on weekends Anusol per rectum sent to pharmacy Start Benefiber or Metamucil daily Return in 4 months.   Patient inadvertently took son's Xarelto X 1 dose yesterday after picking up pharmacy prescriptions. He is aware to avoid this as it is NOT his prescription (son has same name).   Follow Up Instructions:    I discussed the assessment and treatment plan with the patient. The patient was provided an opportunity to ask questions and all were answered. The patient agreed with the plan and demonstrated an understanding of the instructions.   The patient was advised to call back or seek an in-person evaluation if the symptoms worsen or if the  condition fails to improve as anticipated.  I provided 25 minutes of face-to-face time during this video encounter.  Gelene MinkAnna W. Araly Kaas, PhD, ANP-BC [facility]

## 2019-03-14 NOTE — Patient Instructions (Signed)
We are arranging a colonoscopy with Dr. Jena Gauss in the near future.  I have sent in cream to use per rectum twice a day for the next week.   Start taking Benefiber or Metamucil daily. I have provided a handout with more information of where to find the generic versions.  DO NOT TAKE Xarelto. Please call Dr. Scharlene Gloss office to verify what medications your son should be taking.   We will see you back in 4 months!  It was a pleasure to see you today. I strive to create trusting relationships with patients to provide genuine, compassionate, and quality care. I value your feedback. If you receive a survey regarding your visit,  I greatly appreciate you taking time to fill this out.   Gelene Mink, PhD, ANP-BC [facility]

## 2019-03-15 ENCOUNTER — Encounter: Payer: Self-pay | Admitting: Internal Medicine

## 2019-03-19 NOTE — Telephone Encounter (Signed)
Pre-op appt 05/14/19 at 9:00am. Letter mailed with procedure instructions.

## 2019-04-03 ENCOUNTER — Ambulatory Visit: Payer: BLUE CROSS/BLUE SHIELD | Admitting: Family Medicine

## 2019-05-14 ENCOUNTER — Encounter (HOSPITAL_COMMUNITY)
Admission: RE | Admit: 2019-05-14 | Discharge: 2019-05-14 | Disposition: A | Discharge status: Home / Self Care | Payer: BC Managed Care – PPO | Source: Ambulatory Visit | Attending: Internal Medicine | Admitting: Internal Medicine

## 2019-05-14 ENCOUNTER — Encounter (HOSPITAL_COMMUNITY): Payer: Self-pay

## 2019-05-14 ENCOUNTER — Other Ambulatory Visit (HOSPITAL_COMMUNITY)
Admission: RE | Admit: 2019-05-14 | Discharge: 2019-05-14 | Disposition: A | Discharge status: Home / Self Care | Payer: BC Managed Care – PPO | Source: Ambulatory Visit | Attending: Internal Medicine | Admitting: Internal Medicine

## 2019-05-14 ENCOUNTER — Other Ambulatory Visit: Payer: Self-pay

## 2019-05-14 DIAGNOSIS — Z20828 Contact with and (suspected) exposure to other viral communicable diseases: Secondary | ICD-10-CM | POA: Diagnosis not present

## 2019-05-14 DIAGNOSIS — Z01812 Encounter for preprocedural laboratory examination: Secondary | ICD-10-CM | POA: Insufficient documentation

## 2019-05-14 LAB — SARS CORONAVIRUS 2 (TAT 6-24 HRS): SARS Coronavirus 2: NEGATIVE

## 2019-05-16 ENCOUNTER — Telehealth: Payer: Self-pay | Admitting: *Deleted

## 2019-05-16 NOTE — Telephone Encounter (Signed)
Patient has been rescheduled to 10/1 at 12:15pm. Patient aware I will mail him a new pre-op appt and new prep instructions (confirmed mailing address). I advised will send new Rx to go ahead and pick this up so he will have it. I also advised patient if he does not receive his instructions in the next 1 week to call. Don't wait until it was closer to procedure. He voiced understanding.

## 2019-05-16 NOTE — Telephone Encounter (Signed)
noted 

## 2019-05-16 NOTE — Telephone Encounter (Signed)
Called patient and he is aware. 

## 2019-05-16 NOTE — Telephone Encounter (Signed)
Regroup.  Reschedule.  Patient needs to follow instructions.

## 2019-05-16 NOTE — Telephone Encounter (Signed)
[phone]  Pt has questions about his TCS with Propofol tomorrow.

## 2019-05-16 NOTE — Telephone Encounter (Addendum)
Called patient. He states he does not have his instructions for his procedure scheduled for tomorrow. He didn't even pick up prep. I advised patient instructions mailed with his pre-op appt 03/19/2019. Patient did have pre-op and covid-19 testing done but states he doesn't know where his prep instructions are. Patient did not start clear liquids after lunch yesterday. Today he states he had 1 egg only for breakfast and has not had anything else to eat. Please advise Dr. Gala Romney if patient can proceed with procedure scheduled tomorrow? Thanks

## 2019-07-11 ENCOUNTER — Ambulatory Visit: Payer: BLUE CROSS/BLUE SHIELD | Admitting: Gastroenterology

## 2019-07-11 ENCOUNTER — Other Ambulatory Visit: Payer: Self-pay

## 2019-07-11 ENCOUNTER — Telehealth: Payer: Self-pay

## 2019-07-11 NOTE — Telephone Encounter (Signed)
Please No show patient appointment. Thanks

## 2019-07-11 NOTE — Telephone Encounter (Signed)
PATIENT CALLED HERE SEVERAL TIMES, WITH NO CELL RECEPTION.  CANCELLED PATIENT AND MADE HIM AN OFFICE VISIT APPOINTMENT AND MAILED LETTER

## 2019-07-11 NOTE — Telephone Encounter (Signed)
Tried to call pt for telephone visit, no answer, VM box is full.

## 2019-07-16 ENCOUNTER — Other Ambulatory Visit: Payer: Self-pay

## 2019-07-16 ENCOUNTER — Encounter (HOSPITAL_COMMUNITY): Payer: Self-pay

## 2019-07-17 ENCOUNTER — Other Ambulatory Visit (HOSPITAL_COMMUNITY)
Admission: RE | Admit: 2019-07-17 | Discharge: 2019-07-17 | Disposition: A | Discharge status: Home / Self Care | Payer: Self-pay | Source: Ambulatory Visit | Attending: Internal Medicine | Admitting: Internal Medicine

## 2019-07-17 ENCOUNTER — Encounter (HOSPITAL_COMMUNITY)
Admission: RE | Admit: 2019-07-17 | Discharge: 2019-07-17 | Disposition: A | Discharge status: Home / Self Care | Payer: Self-pay | Source: Ambulatory Visit | Attending: Internal Medicine | Admitting: Internal Medicine

## 2019-07-18 ENCOUNTER — Telehealth: Payer: Self-pay | Admitting: Internal Medicine

## 2019-07-18 ENCOUNTER — Other Ambulatory Visit: Payer: Self-pay

## 2019-07-18 ENCOUNTER — Other Ambulatory Visit (HOSPITAL_COMMUNITY)
Admission: RE | Admit: 2019-07-18 | Discharge: 2019-07-18 | Disposition: A | Discharge status: Home / Self Care | Payer: Self-pay | Source: Ambulatory Visit | Attending: Internal Medicine | Admitting: Internal Medicine

## 2019-07-18 NOTE — Telephone Encounter (Signed)
Melanie at [facility] called office, pt didn't show up for COVID test this morning. Tried to call pt, no answer, LMOVM to inform him procedure for tomorrow has been cancelled d/t he didn't have COVID test.   FYI to AB.

## 2019-07-18 NOTE — Telephone Encounter (Signed)
Called pt, COVID test scheduled for 10:35am today. Endo scheduler informed.

## 2019-07-18 NOTE — Telephone Encounter (Signed)
Threasa Beards called this morning to let us know patient was a no show for his covid test yesterday and his scheduled with RMR tomorrow

## 2019-07-19 ENCOUNTER — Ambulatory Visit (HOSPITAL_COMMUNITY)
Admission: RE | Admit: 2019-07-19 | Payer: BLUE CROSS/BLUE SHIELD | Source: Home / Self Care | Admitting: Internal Medicine

## 2019-07-19 ENCOUNTER — Encounter (HOSPITAL_COMMUNITY): Admission: RE | Payer: Self-pay | Source: Home / Self Care

## 2019-07-19 SURGERY — COLONOSCOPY WITH PROPOFOL
Anesthesia: Monitor Anesthesia Care

## 2019-08-03 ENCOUNTER — Encounter: Payer: Self-pay | Admitting: Internal Medicine

## 2019-08-03 ENCOUNTER — Ambulatory Visit: Payer: Self-pay | Admitting: Gastroenterology

## 2019-09-19 ENCOUNTER — Other Ambulatory Visit: Payer: Self-pay

## 2019-09-19 ENCOUNTER — Emergency Department (HOSPITAL_COMMUNITY)
Admission: EM | Admit: 2019-09-19 | Discharge: 2019-09-19 | Disposition: A | Discharge status: Home / Self Care | Payer: Self-pay | Attending: Emergency Medicine | Admitting: Emergency Medicine

## 2019-09-19 ENCOUNTER — Encounter (HOSPITAL_COMMUNITY): Payer: Self-pay

## 2019-09-19 DIAGNOSIS — I10 Essential (primary) hypertension: Secondary | ICD-10-CM | POA: Insufficient documentation

## 2019-09-19 DIAGNOSIS — F17210 Nicotine dependence, cigarettes, uncomplicated: Secondary | ICD-10-CM | POA: Insufficient documentation

## 2019-09-19 LAB — CBC
HCT: 45.4 % (ref 39.0–52.0)
Hemoglobin: 14.1 g/dL (ref 13.0–17.0)
MCH: 26.7 pg (ref 26.0–34.0)
MCHC: 31.1 g/dL (ref 30.0–36.0)
MCV: 86 fL (ref 80.0–100.0)
Platelets: 283 10*3/uL (ref 150–400)
RBC: 5.28 MIL/uL (ref 4.22–5.81)
RDW: 15.7 % — ABNORMAL HIGH (ref 11.5–15.5)
WBC: 8.8 10*3/uL (ref 4.0–10.5)
nRBC: 0 % (ref 0.0–0.2)

## 2019-09-19 LAB — BASIC METABOLIC PANEL
Anion gap: 11 (ref 5–15)
BUN: 12 mg/dL (ref 6–20)
CO2: 26 mmol/L (ref 22–32)
Calcium: 8.9 mg/dL (ref 8.9–10.3)
Chloride: 100 mmol/L (ref 98–111)
Creatinine, Ser: 0.98 mg/dL (ref 0.61–1.24)
GFR calc Af Amer: >60 mL/min (ref >60)
GFR calc non Af Amer: >60 mL/min (ref >60)
Glucose, Bld: 106 mg/dL — ABNORMAL HIGH (ref 70–99)
Potassium: 3.4 mmol/L — ABNORMAL LOW (ref 3.5–5.1)
Sodium: 137 mmol/L (ref 135–145)

## 2019-09-19 MED ORDER — LISINOPRIL 20 MG PO TABS: 20.0000 mg | ORAL_TABLET | Freq: Every day | ORAL | 1 refills | Status: DC

## 2019-09-19 MED ORDER — LISINOPRIL 10 MG PO TABS
20.0000 mg | ORAL_TABLET | Freq: Every day | ORAL | Status: DC
  Administered 2019-09-19: 20 mg via ORAL
  Filled 2019-09-19: qty 2

## 2019-09-19 NOTE — ED Triage Notes (Signed)
Pt reports feeling lightheaded since last night.  Reports bp was 160/108.  Pt says is supposed to be on bp medication but hasn't taken it in the past several months.  Denies headache or other pain.

## 2019-09-19 NOTE — Discharge Instructions (Addendum)
Follow-up for further adjustments of blood pressure medicine will be important.  Take the lisinopril as directed.  Prescription provided.  Return for any new or worse symptoms.  Today's labs without significant abnormalities.  Patient's blood pressure improved here on the lisinopril medication.  Patient can return to work.  No restrictions.

## 2019-09-19 NOTE — ED Provider Notes (Signed)
[facility] EMERGENCY DEPARTMENT Provider Note   CSN: [id number] Arrival date & time: 09/19/19  0102     History   Chief Complaint Chief Complaint  Patient presents with  . Hypertension    HPI Timothy Dalton is a 53 y.o. male.     Patient with a known history of hypertension.  Was started on lisinopril back in May.  Patient's been out of his blood pressure for a while.  States he felt lightheaded last night noted that his blood pressures morning was 160/108.  Patient denies headache chest pain or shortness of breath or any leg swelling.     Past Medical History:  Diagnosis Date  . Gallstone   . HTN (hypertension)   . Pancreatitis     Patient Active Problem List   Diagnosis Date Noted  . Rectal bleeding 03/14/2019  . Pancreatitis, acute 04/22/2016  . Abdominal pain, epigastric 04/22/2016  . Gallstones 04/22/2016  . Abnormal LFTs 04/22/2016  . Acute pancreatitis 04/22/2016  . Gallstone pancreatitis     Past Surgical History:  Procedure Laterality Date  . CHOLECYSTECTOMY N/A 04/30/2016   Procedure: LAPAROSCOPIC CHOLECYSTECTOMY;  Surgeon: Vickie Epley, MD;  Location: [facility];  Service: General;  Laterality: N/A;        Home Medications    Prior to Admission medications   Medication Sig Start Date End Date Taking? Authorizing Provider  ibuprofen (ADVIL) 200 MG tablet Take 400 mg by mouth every 6 (six) hours as needed (pain).    [provider]  lisinopril (ZESTRIL) 20 MG tablet Take 1 tablet (20 mg total) by mouth daily. Patient not taking: Reported on 05/10/2019 03/13/19   Milton Ferguson, MD  lisinopril (ZESTRIL) 20 MG tablet Take 1 tablet (20 mg total) by mouth daily. 09/19/19   Fredia Sorrow, MD    Family History Family History  Problem Relation Age of Onset  . Colon cancer Neg Hx   . Pancreatic disease Neg Hx   . Liver disease Neg Hx   . Colon polyps Neg Hx     Social History Social History   Tobacco Use  . Smoking status: Current  Every Day Smoker    Packs/day: 0.25    Types: Cigarettes  . Smokeless tobacco: Never Used  Substance Use Topics  . Alcohol use: Yes    Alcohol/week: 0.0 standard drinks    Comment: 6 pack on weekends. None during week  . Drug use: No     Allergies   Patient has no known allergies.   Review of Systems Review of Systems  Constitutional: Negative for chills and fever.  HENT: Negative for congestion, rhinorrhea and sore throat.   Eyes: Negative for visual disturbance.  Respiratory: Negative for cough and shortness of breath.   Cardiovascular: Negative for chest pain and leg swelling.  Gastrointestinal: Negative for abdominal pain, diarrhea, nausea and vomiting.  Genitourinary: Negative for dysuria.  Musculoskeletal: Negative for back pain and neck pain.  Skin: Negative for rash.  Neurological: Positive for light-headedness. Negative for dizziness and headaches.  Hematological: Does not bruise/bleed easily.  Psychiatric/Behavioral: Negative for confusion.     Physical Exam Updated Vital Signs BP (!) 146/99   Pulse 89   Temp 98.4 F (36.9 C) (Oral)   Resp 14   Ht 1.854 m (6\' 1" )   Wt (!) 158.8 kg   SpO2 97%   BMI 46.18 kg/m   Physical Exam Vitals signs and nursing note reviewed.  Constitutional:      Appearance:  He is well-developed.  HENT:     Head: Normocephalic and atraumatic.  Eyes:     Extraocular Movements: Extraocular movements intact.     Conjunctiva/sclera: Conjunctivae normal.     Pupils: Pupils are equal, round, and reactive to light.  Neck:     Musculoskeletal: Normal range of motion and neck supple.  Cardiovascular:     Rate and Rhythm: Normal rate and regular rhythm.     Heart sounds: No murmur.  Pulmonary:     Effort: Pulmonary effort is normal. No respiratory distress.     Breath sounds: Normal breath sounds.  Abdominal:     Palpations: Abdomen is soft.     Tenderness: There is no abdominal tenderness.  Musculoskeletal: Normal range of  motion.        General: No swelling.  Skin:    General: Skin is warm and dry.  Neurological:     General: No focal deficit present.     Mental Status: He is alert and oriented to person, place, and time.     Cranial Nerves: No cranial nerve deficit.     Sensory: No sensory deficit.     Motor: No weakness.      ED Treatments / Results  Labs (all labs ordered are listed, but only abnormal results are displayed) Labs Reviewed  CBC - Abnormal; Notable for the following components:      Result Value   RDW 15.7 (*)    All other components within normal limits  BASIC METABOLIC PANEL - Abnormal; Notable for the following components:   Potassium 3.4 (*)    Glucose, Bld 106 (*)    All other components within normal limits    EKG EKG Interpretation  Date/Time:  Wednesday September 19 2019 08:39:40 EST Ventricular Rate:  100 PR Interval:    QRS Duration: 94 QT Interval:  367 QTC Calculation: 474 R Axis:   -27 Text Interpretation: Sinus tachycardia Atrial premature complexes Borderline left axis deviation Abnormal R-wave progression, late transition Borderline T wave abnormalities Baseline wander in lead(s) III V4 V6 No significant change since last tracing Confirmed by Vanetta Mulders [phone]) on 09/19/2019 8:53:08 AM   Radiology No results found.  Procedures Procedures (including critical care time)  Medications Ordered in ED Medications  lisinopril (ZESTRIL) tablet 20 mg (20 mg Oral Given 09/19/19 0926)     Initial Impression / Assessment and Plan / ED Course  I have reviewed the triage vital signs and the nursing notes.  Pertinent labs & imaging results that were available during my care of the patient were reviewed by me and considered in my medical decision making (see chart for details).        Blood pressure shows signs of some improvement here after the lisinopril.  Patient's labs without significant abnormalities.  No renal function abnormalities.  No  electrolyte abnormalities.  Patient will be given information about free clinic.  Begin prescription for lisinopril.  Patient understands importance of controlling his hypertension.  No evidence of any significant endorgan damage at this time.  Final Clinical Impressions(s) / ED Diagnoses   Final diagnoses:  Essential hypertension    ED Discharge Orders         Ordered    lisinopril (ZESTRIL) 20 MG tablet  Daily     09/19/19 1005           Vanetta Mulders, MD 09/19/19 1007

## 2020-03-03 ENCOUNTER — Emergency Department (HOSPITAL_COMMUNITY): Payer: No Typology Code available for payment source

## 2020-03-03 ENCOUNTER — Emergency Department (HOSPITAL_COMMUNITY)
Admission: EM | Admit: 2020-03-03 | Discharge: 2020-03-03 | Disposition: A | Discharge status: Home / Self Care | Payer: No Typology Code available for payment source | Attending: Emergency Medicine | Admitting: Emergency Medicine

## 2020-03-03 ENCOUNTER — Encounter (HOSPITAL_COMMUNITY): Payer: Self-pay

## 2020-03-03 ENCOUNTER — Other Ambulatory Visit: Payer: Self-pay

## 2020-03-03 DIAGNOSIS — F17210 Nicotine dependence, cigarettes, uncomplicated: Secondary | ICD-10-CM | POA: Diagnosis not present

## 2020-03-03 DIAGNOSIS — S20211A Contusion of right front wall of thorax, initial encounter: Secondary | ICD-10-CM

## 2020-03-03 DIAGNOSIS — S299XXA Unspecified injury of thorax, initial encounter: Secondary | ICD-10-CM | POA: Diagnosis present

## 2020-03-03 DIAGNOSIS — S8991XA Unspecified injury of right lower leg, initial encounter: Secondary | ICD-10-CM | POA: Diagnosis not present

## 2020-03-03 DIAGNOSIS — S99911A Unspecified injury of right ankle, initial encounter: Secondary | ICD-10-CM

## 2020-03-03 DIAGNOSIS — Y92410 Unspecified street and highway as the place of occurrence of the external cause: Secondary | ICD-10-CM | POA: Diagnosis not present

## 2020-03-03 DIAGNOSIS — Y998 Other external cause status: Secondary | ICD-10-CM | POA: Insufficient documentation

## 2020-03-03 DIAGNOSIS — I10 Essential (primary) hypertension: Secondary | ICD-10-CM

## 2020-03-03 DIAGNOSIS — Y9389 Activity, other specified: Secondary | ICD-10-CM | POA: Diagnosis not present

## 2020-03-03 MED ORDER — LISINOPRIL 20 MG PO TABS: 20.0000 mg | ORAL_TABLET | Freq: Every day | ORAL | 1 refills | Status: DC
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
## 2020-03-03 NOTE — Discharge Instructions (Addendum)
X-rays did not show any broken bones or serious problems.  For the pain use ibuprofen 400 mg 3 times a day with meals.  Use ice on sore spots for 1 or 2 days after that use heat.  See the doctor of your choice if not better in 3 or 4 days.  Your blood pressure was mildly elevated today.  Make sure you stay on low-salt diet.  Follow-up with a medical doctor for a blood pressure check in 1 or 2 weeks.
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
## 2020-06-26 ENCOUNTER — Other Ambulatory Visit: Payer: Self-pay

## 2020-06-26 DIAGNOSIS — Z20822 Contact with and (suspected) exposure to covid-19: Secondary | ICD-10-CM

## 2020-06-28 LAB — NOVEL CORONAVIRUS, NAA: SARS-CoV-2, NAA: NOT DETECTED

## 2020-06-28 LAB — SARS-COV-2, NAA 2 DAY TAT
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
## 2022-01-02 ENCOUNTER — Emergency Department (HOSPITAL_COMMUNITY): Payer: Self-pay

## 2022-01-02 ENCOUNTER — Encounter (HOSPITAL_COMMUNITY): Payer: Self-pay | Admitting: *Deleted

## 2022-01-02 ENCOUNTER — Other Ambulatory Visit: Payer: Self-pay

## 2022-01-02 ENCOUNTER — Emergency Department (HOSPITAL_COMMUNITY)
Admission: EM | Admit: 2022-01-02 | Discharge: 2022-01-02 | Disposition: A | Discharge status: Home / Self Care | Payer: Self-pay | Attending: Emergency Medicine | Admitting: Emergency Medicine

## 2022-01-02 DIAGNOSIS — R0602 Shortness of breath: Secondary | ICD-10-CM | POA: Insufficient documentation

## 2022-01-02 DIAGNOSIS — M5410 Radiculopathy, site unspecified: Secondary | ICD-10-CM

## 2022-01-02 DIAGNOSIS — I10 Essential (primary) hypertension: Secondary | ICD-10-CM | POA: Insufficient documentation

## 2022-01-02 DIAGNOSIS — R079 Chest pain, unspecified: Secondary | ICD-10-CM | POA: Insufficient documentation

## 2022-01-02 DIAGNOSIS — Z79899 Other long term (current) drug therapy: Secondary | ICD-10-CM | POA: Insufficient documentation

## 2022-01-02 DIAGNOSIS — R200 Anesthesia of skin: Secondary | ICD-10-CM | POA: Insufficient documentation

## 2022-01-02 DIAGNOSIS — R0981 Nasal congestion: Secondary | ICD-10-CM | POA: Insufficient documentation

## 2022-01-02 LAB — BASIC METABOLIC PANEL
Anion gap: 10 (ref 5–15)
BUN: 10 mg/dL (ref 6–20)
CO2: 26 mmol/L (ref 22–32)
Calcium: 9.1 mg/dL (ref 8.9–10.3)
Chloride: 103 mmol/L (ref 98–111)
Creatinine, Ser: 0.79 mg/dL (ref 0.61–1.24)
GFR, Estimated: >60 mL/min (ref >60)
Glucose, Bld: 136 mg/dL — ABNORMAL HIGH (ref 70–99)
Potassium: 3.7 mmol/L (ref 3.5–5.1)
Sodium: 139 mmol/L (ref 135–145)

## 2022-01-02 LAB — CBC WITH DIFFERENTIAL/PLATELET
Abs Immature Granulocytes: 0.02 10*3/uL (ref 0.00–0.07)
Basophils Absolute: 0.1 10*3/uL (ref 0.0–0.1)
Basophils Relative: 1 %
Eosinophils Absolute: 0.2 10*3/uL (ref 0.0–0.5)
Eosinophils Relative: 2 %
HCT: 42.9 % (ref 39.0–52.0)
Hemoglobin: 13.8 g/dL (ref 13.0–17.0)
Immature Granulocytes: 0 %
Lymphocytes Relative: 43 %
Lymphs Abs: 3.6 10*3/uL (ref 0.7–4.0)
MCH: 27.9 pg (ref 26.0–34.0)
MCHC: 32.2 g/dL (ref 30.0–36.0)
MCV: 86.7 fL (ref 80.0–100.0)
Monocytes Absolute: 0.7 10*3/uL (ref 0.1–1.0)
Monocytes Relative: 9 %
Neutro Abs: 3.8 10*3/uL (ref 1.7–7.7)
Neutrophils Relative %: 45 %
Platelets: 296 10*3/uL (ref 150–400)
RBC: 4.95 MIL/uL (ref 4.22–5.81)
RDW: 14.8 % (ref 11.5–15.5)
WBC: 8.3 10*3/uL (ref 4.0–10.5)
nRBC: 0 % (ref 0.0–0.2)

## 2022-01-02 LAB — TROPONIN I (HIGH SENSITIVITY)
Troponin I (High Sensitivity): 4 ng/L (ref <18)
Troponin I (High Sensitivity): 4 ng/L (ref <18)

## 2022-01-02 LAB — CBC
HCT: 37.4 % — ABNORMAL LOW (ref 39.0–52.0)
Hemoglobin: 11.8 g/dL — ABNORMAL LOW (ref 13.0–17.0)
MCH: 27 pg (ref 26.0–34.0)
MCHC: 31.6 g/dL (ref 30.0–36.0)
MCV: 85.6 fL (ref 80.0–100.0)
Platelets: 324 10*3/uL (ref 150–400)
RBC: 4.37 MIL/uL (ref 4.22–5.81)
RDW: 15.3 % (ref 11.5–15.5)
WBC: 21.4 10*3/uL — ABNORMAL HIGH (ref 4.0–10.5)
nRBC: 0 % (ref 0.0–0.2)

## 2022-01-02 LAB — D-DIMER, QUANTITATIVE: D-Dimer, Quant: 0.55 ug/mL-FEU — ABNORMAL HIGH (ref 0.00–0.50)

## 2022-01-02 MED ORDER — PREDNISONE 10 MG PO TABS: 20.0000 mg | ORAL_TABLET | Freq: Every day | ORAL | 0 refills | Status: DC

## 2022-01-02 MED ORDER — SODIUM CHLORIDE 0.9 % IV BOLUS
1000.0000 mL | Freq: Once | INTRAVENOUS | Status: AC
  Administered 2022-01-02: 1000 mL via INTRAVENOUS

## 2022-01-02 MED ORDER — ASPIRIN 325 MG PO TABS
325.0000 mg | ORAL_TABLET | Freq: Once | ORAL | Status: AC
  Administered 2022-01-02: 325 mg via ORAL
  Filled 2022-01-02: qty 1

## 2022-01-02 MED ORDER — LISINOPRIL 20 MG PO TABS: 20.0000 mg | ORAL_TABLET | Freq: Every day | ORAL | 1 refills | Status: DC

## 2022-01-02 MED ORDER — LORATADINE 10 MG PO TABS: ORAL_TABLET | ORAL | 0 refills | Status: DC

## 2022-01-02 NOTE — ED Notes (Addendum)
Patient stated that phlebotomy has not stuck him for blood. This nurse called to verify this with lab due to critical result that was communicated. Lab stated they were working to fix this issue. PA notified of this error.  ?

## 2022-01-02 NOTE — ED Notes (Signed)
Lab called at this time to clarify if incorrect labs would be removed from patients chart.  ?

## 2022-01-02 NOTE — Discharge Instructions (Signed)
Follow-up with Dr. Harl Bowie in the next couple weeks for the changes on your heart tracing.  Follow-up with Dr. Ellene Route for that numbness in your arm.  Get yourself a family doctor ?

## 2022-01-02 NOTE — ED Triage Notes (Signed)
Pt with left sided CP with mild SOB since last night.  Pt states he felt like his left hand going to sleep for past 2-3 days.  Denies any numbness elsewhere. Denies any N/V ?

## 2022-01-02 NOTE — ED Provider Notes (Signed)
?[place] ?Provider Note ? ? ?CSN: [id number] ?Arrival date & time: 01/02/22  1611 ? ?  ? ?History ? ?Chief Complaint  ?Patient presents with  ? Chest Pain  ? ? ?Timothy Dalton is a 56 y.o. male with history of hypertension which he states he has never been prescribed medication and history of gallstone pancreatitis presenting with a 1 day history of left-sided chest pain with some mild shortness of breath.  He describes sudden onset of sharp stabbing pain very localized at his left breast region and lasted for several seconds, this pain occurred at rest and resolved promptly.  He reports this afternoon having another similar episode although not as sharp also lasting for several seconds now resolved but has some mild shortness of breath which is worsened with exertion.  He has had no diaphoresis, denies nausea or vomiting, palpitations dizziness.  He does report having intermittent episodes of numbness in his left arm which has been happening intermittently for weeks.  He describes it feels like his left arm is going to sleep on him and appears to be positional, he describes when he lies on his stomach propping his body up with his arms and his left arm will go numb and when he moves his position and shakes at the arm it improves.  He does not have this symptom currently.  He has had no treatment prior to arrival.  He denies neck pain or injury.  He denies weakness in his arms.  He has had no treatment prior to arrival. ? ?Of note he does have complaints of what he suspects is a sinus infection, he has had significant nasal congestion along with sinus pressure.  He has had thick nasal discharge and postnasal drip, sometimes purulent, nonbloody.  He is asking for an antibiotic for this complaint.  He has had no treatments for this prior to arrival. ? ?The history is provided by the patient.  ? ?  ? ?Home Medications ?Prior to Admission medications   ?Medication Sig Start Date End Date Taking?  Authorizing Provider  ?lisinopril (ZESTRIL) 40 MG tablet Take by mouth. 04/23/20  Yes [provider]  ?ibuprofen (ADVIL) 200 MG tablet Take 400 mg by mouth every 6 (six) hours as needed (pain).    [provider]  ?lisinopril (ZESTRIL) 20 MG tablet Take 1 tablet (20 mg total) by mouth daily. 03/03/20   Daleen Bo, MD  ?   ? ?Allergies    ?Patient has no known allergies.   ? ?Review of Systems   ?Review of Systems  ?Constitutional:  Negative for chills and fever.  ?HENT:  Positive for congestion, postnasal drip and sinus pressure. Negative for sore throat.   ?Eyes: Negative.   ?Respiratory:  Negative for chest tightness and shortness of breath.   ?Cardiovascular:  Positive for chest pain.  ?Gastrointestinal:  Negative for abdominal pain and nausea.  ?Genitourinary: Negative.   ?Musculoskeletal:  Negative for arthralgias, joint swelling and neck pain.  ?Skin: Negative.  Negative for rash and wound.  ?Neurological:  Positive for numbness. Negative for dizziness, weakness, light-headedness and headaches.  ?Psychiatric/Behavioral: Negative.    ?All other systems reviewed and are negative. ? ?Physical Exam ?Updated Vital Signs ?BP (!) 152/95   Pulse 92   Temp 98.8 ?F (37.1 ?C) (Oral)   Resp 20   Ht 6' 1" (1.854 m)   Wt (!) 145.2 kg   SpO2 99%   BMI 42.22 kg/m?  ?Physical Exam ?Vitals and nursing  note reviewed.  ?Constitutional:   ?   Appearance: He is well-developed.  ?HENT:  ?   Head: Normocephalic and atraumatic.  ?   Nose: Congestion present.  ?Eyes:  ?   Conjunctiva/sclera: Conjunctivae normal.  ?Cardiovascular:  ?   Rate and Rhythm: Normal rate and regular rhythm.  ?   Heart sounds: Normal heart sounds.  ?Pulmonary:  ?   Effort: Pulmonary effort is normal.  ?   Breath sounds: Normal breath sounds. No decreased breath sounds, wheezing or rhonchi.  ?Abdominal:  ?   General: Bowel sounds are normal.  ?   Palpations: Abdomen is soft.  ?   Tenderness: There is no abdominal tenderness.   ?Musculoskeletal:     ?   General: Normal range of motion.  ?   Cervical back: Normal and normal range of motion. No tenderness.  ?Skin: ?   General: Skin is warm and dry.  ?Neurological:  ?   General: No focal deficit present.  ?   Mental Status: He is alert and oriented to person, place, and time.  ?   Cranial Nerves: Cranial nerves 2-12 are intact.  ?   Sensory: Sensation is intact.  ?   Motor: Motor function is intact.  ?   Comments: Equal grip strength  ? ? ?ED Results / Procedures / Treatments   ?Labs ?(all labs ordered are listed, but only abnormal results are displayed) ? ? ?EKG ?EKG Interpretation ? ?Date/Time:  Saturday January 02 2022 16:21:26 EDT ?Ventricular Rate:  98 ?PR Interval:  160 ?QRS Duration: 98 ?QT Interval:  380 ?QTC Calculation: 485 ?R Axis:   -41 ?Text Interpretation: Normal sinus rhythm Left axis deviation Possible Anterior infarct , age undetermined Abnormal ECG When compared with ECG of 19-Sep-2019 08:39, PREVIOUS ECG IS PRESENT Confirmed by Milton Ferguson [phone]) on 01/02/2022 5:58:16 PM ? ?Radiology ?DG Chest 2 View ? ?Result Date: 01/02/2022 ?CLINICAL DATA:  Acute chest pain and shortness of breath. EXAM: CHEST - 2 VIEW COMPARISON:  03/03/2020 FINDINGS: The cardiomediastinal silhouette is unremarkable. Mild chronic interstitial/peribronchial thickening again noted. There is no evidence of focal airspace disease, pulmonary edema, suspicious pulmonary nodule/mass, pleural effusion, or pneumothorax. No acute bony abnormalities are identified. IMPRESSION: No active cardiopulmonary disease. Electronically Signed   By: Margarette Canada M.D.   On: 01/02/2022 16:40   ? ?Procedures ?Procedures  ? ? ?Medications Ordered in ED ?Medications  ?sodium chloride 0.9 % bolus 1,000 mL (0 mLs Intravenous Stopped 01/02/22 2002)  ?aspirin tablet 325 mg (325 mg Oral Given 01/02/22 1829)  ? ? ?ED Course/ Medical Decision Making/ A&P ?  ?                        ?Medical Decision Making ?Pt with intermittent transient  chest pain and left arm numbness of unclear etiology but concerning for possible ACS, other possible sources including PE, chf, cervical radiculopathy (arm sx).  Pending troponins. D dimer.   ? ?Pt signed out to Dr. Roderic Palau. ? ? ? ?Amount and/or Complexity of Data Reviewed ?Labs: ordered. ?   Details: Of note the labs that were drawn at 1756 including a be met, troponin and CBC are in error, these results belong to a different patient in this ED.  These labs need to be disregarded. ?Radiology: ordered. ? ? ? ? ? ? ? ? ? ? ?Final Clinical Impression(s) / ED Diagnoses ?Final diagnoses:  ?None  ? ? ?Rx / DC Orders ?ED Discharge  Orders   ? ? None  ? ?  ? ? ?  ?Evalee Jefferson, PA-C ?01/02/22 2005 ? ?  ?Milton Ferguson, MD ?01/04/22 1447 ? ?

## 2022-04-22 DIAGNOSIS — R69 Illness, unspecified: Secondary | ICD-10-CM | POA: Diagnosis not present

## 2022-04-22 DIAGNOSIS — F17210 Nicotine dependence, cigarettes, uncomplicated: Secondary | ICD-10-CM | POA: Diagnosis not present

## 2022-04-22 DIAGNOSIS — F17200 Nicotine dependence, unspecified, uncomplicated: Secondary | ICD-10-CM | POA: Diagnosis not present

## 2022-04-22 DIAGNOSIS — I10 Essential (primary) hypertension: Secondary | ICD-10-CM | POA: Diagnosis not present

## 2022-04-29 DIAGNOSIS — R7303 Prediabetes: Secondary | ICD-10-CM | POA: Diagnosis not present

## 2022-04-29 DIAGNOSIS — E785 Hyperlipidemia, unspecified: Secondary | ICD-10-CM | POA: Diagnosis not present

## 2022-04-29 DIAGNOSIS — I10 Essential (primary) hypertension: Secondary | ICD-10-CM | POA: Diagnosis not present

## 2022-04-29 DIAGNOSIS — Z0001 Encounter for general adult medical examination with abnormal findings: Secondary | ICD-10-CM | POA: Diagnosis not present

## 2022-05-06 ENCOUNTER — Encounter: Payer: Self-pay | Admitting: *Deleted

## 2022-05-12 ENCOUNTER — Other Ambulatory Visit: Payer: Self-pay

## 2022-05-12 ENCOUNTER — Encounter (HOSPITAL_COMMUNITY): Payer: Self-pay | Admitting: *Deleted

## 2022-05-12 ENCOUNTER — Emergency Department (HOSPITAL_COMMUNITY)
Admission: EM | Admit: 2022-05-12 | Discharge: 2022-05-12 | Disposition: A | Discharge status: Home / Self Care | Payer: Self-pay | Attending: Emergency Medicine | Admitting: Emergency Medicine

## 2022-05-12 DIAGNOSIS — H5789 Other specified disorders of eye and adnexa: Secondary | ICD-10-CM | POA: Diagnosis not present

## 2022-05-12 DIAGNOSIS — T65891A Toxic effect of other specified substances, accidental (unintentional), initial encounter: Secondary | ICD-10-CM | POA: Diagnosis not present

## 2022-05-12 DIAGNOSIS — H10211 Acute toxic conjunctivitis, right eye: Secondary | ICD-10-CM | POA: Insufficient documentation

## 2022-05-12 MED ORDER — FLUORESCEIN SODIUM 1 MG OP STRP
1.0000 | ORAL_STRIP | Freq: Once | OPHTHALMIC | Status: AC
  Administered 2022-05-12: 1 via OPHTHALMIC
  Filled 2022-05-12: qty 1

## 2022-05-12 MED ORDER — TETRACAINE HCL 0.5 % OP SOLN
2.0000 [drp] | Freq: Once | OPHTHALMIC | Status: AC
  Administered 2022-05-12: 2 [drp] via OPHTHALMIC
  Filled 2022-05-12: qty 4

## 2022-05-12 MED ORDER — ERYTHROMYCIN 5 MG/GM OP OINT
TOPICAL_OINTMENT | Freq: Once | OPHTHALMIC | Status: AC
  Administered 2022-05-12: 1 via OPHTHALMIC
  Filled 2022-05-12: qty 3.5

## 2022-05-12 NOTE — Discharge Instructions (Signed)
Use the antibiotic ointment given, applying 1/4 inch ribbon to your right eye 3 times a day for the next 7 days.  Use cool compresses if needed to soothe your eye.  If it is crusted over in the morning, you may need to use a warm water soak to clean the eye.

## 2022-05-12 NOTE — ED Triage Notes (Signed)
Pt in reports scratching the skin beside his R eye and was rubbing it and now his eyes is red and blurry, denies double vision, reports yellow drainage from the R eye, A&O x4

## 2022-05-13 NOTE — ED Provider Notes (Signed)
[facility] EMERGENCY DEPARTMENT Provider Note   CSN: [id number] Arrival date & time: 05/12/22  1603     History  Chief Complaint  Patient presents with   Eye Pain    Timothy Dalton is a 56 y.o. male presenting with right eye redness, scratchy feeling with blinking and eyelid crusting for the past 2 days.  He scratching the inside corner of his eye with his fingernail causing an abrasion of his medial upper lid.  He applied rubbing alcohol to try to clean the wound and got some alcohol in the eye itself which caused pain, and now redness and drainage.  He does not wear contacts or glasses.  He denies any vision changes except blurry due to frequent tearing.  He has no involvement with the other eye.  Denies foreign body sensation, fevers, chills, nasal congestion or drainage.  He has found no alleviators for his symptoms.  The history is provided by the patient.       Home Medications Prior to Admission medications   Medication Sig Start Date End Date Taking? Authorizing Provider  ibuprofen (ADVIL) 200 MG tablet Take 400 mg by mouth every 6 (six) hours as needed (pain).    [provider]  lisinopril (ZESTRIL) 20 MG tablet Take 1 tablet (20 mg total) by mouth daily. 01/02/22   Bethann Berkshire, MD  loratadine (CLARITIN) 10 MG tablet Take 1 a day for drainage in your sinuses 01/02/22   Bethann Berkshire, MD  predniSONE (DELTASONE) 10 MG tablet Take 2 tablets (20 mg total) by mouth daily. 01/02/22   Bethann Berkshire, MD      Allergies    Patient has no known allergies.    Review of Systems   Review of Systems  Constitutional:  Negative for chills and fever.  HENT:  Negative for congestion, ear pain, rhinorrhea, sinus pressure and sore throat.   Eyes:  Positive for pain, discharge, redness, itching and visual disturbance. Negative for photophobia.  Respiratory:  Positive for cough.   Cardiovascular: Negative.   Gastrointestinal: Negative.     Physical Exam Updated Vital  Signs BP 124/89 (BP Location: Right Arm)   Pulse 88   Temp 98.5 F (36.9 C) (Oral)   Resp 16   Ht 6\' 4"  (1.93 m)   SpO2 98%   BMI 38.95 kg/m  Physical Exam Constitutional:      Appearance: He is well-developed.  HENT:     Head: Normocephalic and atraumatic.     Nose: Mucosal edema present. No rhinorrhea.     Mouth/Throat:     Mouth: Mucous membranes are moist.     Pharynx: Oropharynx is clear. Uvula midline. No oropharyngeal exudate or posterior oropharyngeal erythema.     Tonsils: No tonsillar abscesses.  Eyes:     General: Lids are normal. Lids are everted, no foreign bodies appreciated.        Right eye: No foreign body.     Extraocular Movements: Extraocular movements intact.     Conjunctiva/sclera:     Right eye: Right conjunctiva is injected. Exudate present. No chemosis.    Pupils: Pupils are equal, round, and reactive to light.     Right eye: No corneal abrasion or fluorescein uptake. Seidel exam negative.     Slit lamp exam:    Right eye: No corneal flare, corneal ulcer or photophobia.     Comments: White to yellow crusting around eyelid margins.  Small abrasion external skin at medial canthus.  Multiple small skin tags  upper right lid and cheek.  Visual Acuity Bilateral Near: 20/20 Bilateral Distance: 20/20 R Near: 2025 R Distance: 20/25 L Near: 20/20 L Distance: 20/20    Cardiovascular:     Rate and Rhythm: Normal rate.  Pulmonary:     Effort: Pulmonary effort is normal.  Musculoskeletal:        General: Normal range of motion.  Skin:    General: Skin is warm and dry.     Findings: No rash.  Neurological:     Mental Status: He is alert and oriented to person, place, and time.     ED Results / Procedures / Treatments   Labs (all labs ordered are listed, but only abnormal results are displayed) Labs Reviewed - No data to display  EKG None  Radiology No results found.  Procedures Procedures    Medications Ordered in ED Medications   tetracaine (PONTOCAINE) 0.5 % ophthalmic solution 2 drop (2 drops Both Eyes Given 05/12/22 2118)  fluorescein ophthalmic strip 1 strip (1 strip Both Eyes Given 05/12/22 2118)  erythromycin ophthalmic ointment (1 Application Right Eye Given 05/12/22 2157)    ED Course/ Medical Decision Making/ A&P                           Medical Decision Making Pt presenting with right eye pain, conjunctivitis secondary to accidental rubbing alcohol in the eye.  No corneal involvement with normal fluorescein exam.  He was placed on erythromycin ointment tid for comfort and to protect the eye as this injury continues to heal, advised avoiding rubbing the eye.  Cool compresses if itchy and for cleaning dc.  Referral to ophthalmology for recheck if not resolving with this tx plan over the next 5 days.  Normal visual acuity.   Risk Prescription drug management.           Final Clinical Impression(s) / ED Diagnoses Final diagnoses:  Chemical conjunctivitis of right eye    Rx / DC Orders ED Discharge Orders     None         Victoriano Lain 05/13/22 1115    Vanetta Mulders, MD 05/18/22 [phone]

## 2022-06-24 ENCOUNTER — Other Ambulatory Visit: Payer: Self-pay

## 2022-06-24 ENCOUNTER — Encounter (HOSPITAL_COMMUNITY): Payer: Self-pay | Admitting: Emergency Medicine

## 2022-06-24 ENCOUNTER — Emergency Department (HOSPITAL_COMMUNITY)
Admission: EM | Admit: 2022-06-24 | Discharge: 2022-06-24 | Disposition: A | Discharge status: Home / Self Care | Payer: No Typology Code available for payment source | Attending: Emergency Medicine | Admitting: Emergency Medicine

## 2022-06-24 ENCOUNTER — Emergency Department (HOSPITAL_COMMUNITY): Payer: No Typology Code available for payment source

## 2022-06-24 DIAGNOSIS — R000 Tachycardia, unspecified: Secondary | ICD-10-CM | POA: Diagnosis not present

## 2022-06-24 DIAGNOSIS — M25562 Pain in left knee: Secondary | ICD-10-CM | POA: Insufficient documentation

## 2022-06-24 DIAGNOSIS — Y92410 Unspecified street and highway as the place of occurrence of the external cause: Secondary | ICD-10-CM | POA: Insufficient documentation

## 2022-06-24 MED ORDER — METHOCARBAMOL 500 MG PO TABS: 500.0000 mg | ORAL_TABLET | Freq: Two times a day (BID) | ORAL | 0 refills | Status: DC

## 2022-06-24 MED ORDER — NAPROXEN 375 MG PO TABS: 375.0000 mg | ORAL_TABLET | Freq: Two times a day (BID) | ORAL | 0 refills | Status: DC
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
## 2022-06-24 NOTE — Discharge Instructions (Addendum)
Your exam today was overall reassuring.  Left knee x-ray did not show any concerning findings.  I have sent naproxen and Robaxin into the pharmacy for you for symptomatic management.  If you are taking naproxen do not take ibuprofen along with it.  Robaxin is a muscle relaxer that can make you drowsy.  Do not drive or operate heavy machinery after taking this.  If you are unable to avoid these activities then please only take it at bedtime.  For any concerning symptoms return to the emergency room.  We did discuss doing a CT scan of your head however you state you did not want this done and you feel like you are okay.  For any concerning symptoms please return to the emergency room.  This includes vision change, persistent nausea vomiting, or severe headache. You may notice worsening muscle aches in the next day or 2 this is to be expected.
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
## 2022-06-28 ENCOUNTER — Ambulatory Visit (HOSPITAL_COMMUNITY)
Admission: RE | Admit: 2022-06-28 | Discharge: 2022-06-28 | Disposition: A | Discharge status: Home / Self Care | Payer: 59 | Source: Ambulatory Visit | Attending: Gerontology | Admitting: Gerontology

## 2022-06-28 ENCOUNTER — Other Ambulatory Visit (HOSPITAL_COMMUNITY): Payer: Self-pay | Admitting: Gerontology

## 2022-06-28 DIAGNOSIS — M25511 Pain in right shoulder: Secondary | ICD-10-CM

## 2022-07-02 ENCOUNTER — Other Ambulatory Visit: Payer: Self-pay | Admitting: Gerontology

## 2022-07-02 ENCOUNTER — Other Ambulatory Visit (HOSPITAL_COMMUNITY): Payer: Self-pay | Admitting: Gerontology

## 2022-07-02 DIAGNOSIS — R936 Abnormal findings on diagnostic imaging of limbs: Secondary | ICD-10-CM

## 2022-07-05 ENCOUNTER — Other Ambulatory Visit: Payer: Self-pay | Admitting: Gerontology

## 2022-07-05 DIAGNOSIS — R936 Abnormal findings on diagnostic imaging of limbs: Secondary | ICD-10-CM

## 2022-07-06 ENCOUNTER — Ambulatory Visit (HOSPITAL_COMMUNITY)
Admission: RE | Admit: 2022-07-06 | Discharge: 2022-07-06 | Disposition: A | Discharge status: Home / Self Care | Payer: 59 | Source: Ambulatory Visit | Attending: Gerontology | Admitting: Gerontology

## 2022-07-06 DIAGNOSIS — R936 Abnormal findings on diagnostic imaging of limbs: Secondary | ICD-10-CM | POA: Insufficient documentation

## 2022-07-20 ENCOUNTER — Ambulatory Visit (HOSPITAL_COMMUNITY): Payer: 59

## 2022-10-15 ENCOUNTER — Emergency Department (HOSPITAL_COMMUNITY): Payer: Self-pay

## 2022-10-15 ENCOUNTER — Telehealth: Payer: No Typology Code available for payment source | Admitting: Family Medicine

## 2022-10-15 ENCOUNTER — Emergency Department (HOSPITAL_COMMUNITY)
Admission: EM | Admit: 2022-10-15 | Discharge: 2022-10-15 | Disposition: A | Discharge status: Home / Self Care | Payer: Self-pay | Attending: Emergency Medicine | Admitting: Emergency Medicine

## 2022-10-15 ENCOUNTER — Encounter (HOSPITAL_COMMUNITY): Payer: Self-pay

## 2022-10-15 DIAGNOSIS — R0602 Shortness of breath: Secondary | ICD-10-CM

## 2022-10-15 DIAGNOSIS — R7981 Abnormal blood-gas level: Secondary | ICD-10-CM

## 2022-10-15 DIAGNOSIS — R0981 Nasal congestion: Secondary | ICD-10-CM | POA: Insufficient documentation

## 2022-10-15 DIAGNOSIS — I10 Essential (primary) hypertension: Secondary | ICD-10-CM | POA: Insufficient documentation

## 2022-10-15 DIAGNOSIS — Z79899 Other long term (current) drug therapy: Secondary | ICD-10-CM | POA: Insufficient documentation

## 2022-10-15 DIAGNOSIS — R059 Cough, unspecified: Secondary | ICD-10-CM | POA: Insufficient documentation

## 2022-10-15 DIAGNOSIS — Z20822 Contact with and (suspected) exposure to covid-19: Secondary | ICD-10-CM | POA: Insufficient documentation

## 2022-10-15 LAB — BASIC METABOLIC PANEL
Anion gap: 6 (ref 5–15)
BUN: 16 mg/dL (ref 6–20)
CO2: 28 mmol/L (ref 22–32)
Calcium: 8.9 mg/dL (ref 8.9–10.3)
Chloride: 102 mmol/L (ref 98–111)
Creatinine, Ser: 0.95 mg/dL (ref 0.61–1.24)
GFR, Estimated: >60 mL/min (ref >60)
Glucose, Bld: 95 mg/dL (ref 70–99)
Potassium: 3.3 mmol/L — ABNORMAL LOW (ref 3.5–5.1)
Sodium: 136 mmol/L (ref 135–145)

## 2022-10-15 LAB — RESP PANEL BY RT-PCR (RSV, FLU A&B, COVID)  RVPGX2
Influenza A by PCR: NEGATIVE
Influenza B by PCR: NEGATIVE
Resp Syncytial Virus by PCR: NEGATIVE
SARS Coronavirus 2 by RT PCR: NEGATIVE

## 2022-10-15 LAB — BRAIN NATRIURETIC PEPTIDE: B Natriuretic Peptide: 44 pg/mL (ref 0.0–100.0)

## 2022-10-15 LAB — CBC
HCT: 40.6 % (ref 39.0–52.0)
Hemoglobin: 12.7 g/dL — ABNORMAL LOW (ref 13.0–17.0)
MCH: 25.9 pg — ABNORMAL LOW (ref 26.0–34.0)
MCHC: 31.3 g/dL (ref 30.0–36.0)
MCV: 82.9 fL (ref 80.0–100.0)
Platelets: 334 10*3/uL (ref 150–400)
RBC: 4.9 MIL/uL (ref 4.22–5.81)
RDW: 15.6 % — ABNORMAL HIGH (ref 11.5–15.5)
WBC: 8.7 10*3/uL (ref 4.0–10.5)
nRBC: 0 % (ref 0.0–0.2)

## 2022-10-15 MED ORDER — LOSARTAN POTASSIUM-HCTZ 100-25 MG PO TABS: 1.0000 | ORAL_TABLET | Freq: Every day | ORAL | 0 refills | Status: DC

## 2022-10-15 MED ORDER — AMLODIPINE BESYLATE 10 MG PO TABS: 10.0000 mg | ORAL_TABLET | Freq: Every day | ORAL | 0 refills | Status: AC

## 2022-10-15 NOTE — Discharge Instructions (Addendum)
Evaluation for your nasal congestion is overall reassuring.  Be lingering upper respiratory infection.  Recommend conservative treatment at home which includes rest and good hydration.  Can take ibuprofen Tylenol for symptoms.  Advised to follow-up with your PCP if symptoms persist.  I have refilled your losartan and amlodipine.  Please follow-up with your PCP for further refills and reevaluation of your hypertension.

## 2022-10-15 NOTE — ED Provider Notes (Cosign Needed)
[facility] EMERGENCY DEPARTMENT Provider Note   CSN: [id number] Arrival date & time: 10/15/22  1402     History  Chief Complaint  Patient presents with   Nasal Congestion    Pt was at [facility] today for knee surgery and was told to come to the ER because his O2 was 96% and it should be 100% and he had some wheezing.  Pt said he has had nasal congestion with post nasal drip and a mild wheeze for almost 2 months now.   HPI [name] is a 56 y.o. male with HTN presenting for nasal congestion.  States he has had nasal congestion and mild wheezing for the past 2 months.  Was scheduled for knee replacement today at [facility].  Vital signs at that time revealed O2 sat of 96%.  Per patient he was advised to be evaluated in the ED due to concern of lower O2 sat level.  Patient also endorses associated shortness of breath is worse with exertion that has been ongoing for the past month.  Denies chest pain.  Endorses nonproductive cough.  Denies fever.       Home Medications Prior to Admission medications   Medication Sig Start Date End Date Taking? Authorizing Provider  ibuprofen (ADVIL) 200 MG tablet Take 400 mg by mouth every 6 (six) hours as needed (pain).    [provider]  lisinopril (ZESTRIL) 20 MG tablet Take 1 tablet (20 mg total) by mouth daily. 01/02/22   Bethann Berkshire, MD  loratadine (CLARITIN) 10 MG tablet Take 1 a day for drainage in your sinuses 01/02/22   Bethann Berkshire, MD  methocarbamol (ROBAXIN) 500 MG tablet Take 1 tablet (500 mg total) by mouth 2 (two) times daily. 06/24/22   Marita Kansas, PA-C  naproxen (NAPROSYN) 375 MG tablet Take 1 tablet (375 mg total) by mouth 2 (two) times daily. 06/24/22   Karie Mainland, Amjad, PA-C  predniSONE (DELTASONE) 10 MG tablet Take 2 tablets (20 mg total) by mouth daily. 01/02/22   Bethann Berkshire, MD      Allergies    Patient has no known allergies.    Review of Systems   Review of Systems  HENT:  Positive for congestion.    Respiratory:  Positive for shortness of breath.      Physical Exam   Vitals:   10/15/22 1437 10/15/22 1438  BP: (!) 150/84   Pulse: 97   Resp: 16   Temp: 98.2 F (36.8 C)   SpO2: 95% 98%    CONSTITUTIONAL:  well-appearing, NAD NEURO:  Alert and oriented x 3, CN 3-12 grossly intact EYES:  eyes equal and reactive ENT/NECK:  Supple, no stridor  CARDIO:  regular rate and rhythm, appears well-perfused  PULM:  No respiratory distress, CTAB MSK/SPINE:  No gross deformities, no edema, moves all extremities  SKIN:  no rash, atraumatic   *Additional and/or pertinent findings included in MDM below   ED Results / Procedures / Treatments   Labs (all labs ordered are listed, but only abnormal results are displayed) Labs Reviewed  BASIC METABOLIC PANEL - Abnormal; Notable for the following components:      Result Value   Potassium 3.3 (*)    All other components within normal limits  CBC - Abnormal; Notable for the following components:   Hemoglobin 12.7 (*)    MCH 25.9 (*)    RDW 15.6 (*)    All other components within normal limits  RESP PANEL BY RT-PCR (RSV,  FLU A&B, COVID)  RVPGX2  BRAIN NATRIURETIC PEPTIDE    EKG None  Radiology DG Chest 2 View  Result Date: 10/15/2022 CLINICAL DATA:  Shortness of breath, wheezing, nasal congestion EXAM: CHEST - 2 VIEW COMPARISON:  01/02/2022 FINDINGS: Normal heart size, mediastinal contours, and pulmonary vascularity. Calcified granuloma mid to lower RIGHT lung stable. Eventration RIGHT hemidiaphragm noted. No pulmonary infiltrate, pleural effusion, or pneumothorax. Osseous structures unremarkable. IMPRESSION: No acute abnormalities. Electronically Signed   By: Ulyses Southward M.D.   On: 10/15/2022 15:20    Procedures Procedures    Medications Ordered in ED Medications - No data to display  ED Course/ Medical Decision Making/ A&P Clinical Course as of 10/15/22 1754  Fri Oct 15, 2022  1551 DG Chest 2 View [JR]    Clinical Course  User Index [JR] Gareth Eagle, PA-C                           Medical Decision Making Amount and/or Complexity of Data Reviewed Labs: ordered. Radiology: ordered. Decision-making details documented in ED Course.  56 year old male who is well-appearing and hemodynamically stable presenting for nasal congestion and mild shortness of breath.  Physical exam was unremarkable.  Differential diagnosis for this complaint includes CHF, pneumonia, asthma exacerbation, URI, and ACS.  Doubt CHF given no lower extremity edema and dry appearing x-ray and BNP within normal limits.  Doubt pneumonia given patient afebrile no focal consolidation on x-ray.  Doubt asthma exacerbation, no wheezes on exam and patient is not hypoxic or tachypneic.  Doubt ACS given no chest pain and reassuring EKG.  I reviewed and interpreted EKG which revealed normal sinus rhythm.  Patient requested refill of his blood pressure medications.  I sent amlodipine and losartan to his pharmacy per his request.  On reevaluation patient stated that he was no longer short of breath.  Advised patient to follow-up with PCP if symptoms persisted.  Discussed return precautions.         Final Clinical Impression(s) / ED Diagnoses Final diagnoses:  Nasal congestion    Rx / DC Orders ED Discharge Orders     None         Gareth Eagle, PA-C 10/15/22 1757

## 2022-10-15 NOTE — Progress Notes (Signed)
Based on what you shared with me, I feel your condition warrants further evaluation as soon as possible at an Emergency department.    NOTE: There will be NO CHARGE for this eVisit

## 2022-10-15 NOTE — ED Triage Notes (Signed)
Pt was at [facility] today for knee surgery and was told to come to the ER because his O2 was 96% and it should be 100% and he had some wheezing.  Pt said he has had nasal congestion with post nasal drip and a mild wheeze for almost 2 months now.

## 2022-10-25 ENCOUNTER — Other Ambulatory Visit: Payer: Self-pay | Admitting: Orthopedic Surgery

## 2022-10-26 ENCOUNTER — Encounter: Payer: Self-pay | Admitting: *Deleted

## 2022-11-04 NOTE — Progress Notes (Addendum)
COVID Vaccine Completed: yes  Date of COVID positive in last 90 days: no  PCP - Rosita Fire, MD Cardiologist - n/a  Chest x-ray - 10/15/22 Epic EKG - 10/19/22 Epic Stress Test - n/a ECHO - n/a Cardiac Cath - n/a Pacemaker/ICD device last checked: n/a Spinal Cord Stimulator: n/a  Bowel Prep - no  Sleep Study - n/a CPAP -   Fasting Blood Sugar - n/a Checks Blood Sugar _____ times a day  Last dose of GLP1 agonist-  N/A GLP1 instructions:  N/A   Last dose of SGLT-2 inhibitors-  N/A SGLT-2 instructions: N/A   Blood Thinner Instructions: n/a Aspirin Instructions: Last Dose:  Activity level: Can go up a flight of stairs and perform activities of daily living without stopping and without symptoms of chest pain or shortness of breath.   Anesthesia review: BP 171/107 and 176/107, pt denies any symptoms and states he did not take his BP medications yet. He does not have a BP cuff at home. Per Janett Billow, [place] instructed patient to call his PCP and let them know his BP today. Patient verbalized understanding STOP BANG 5  Patient denies shortness of breath, fever, cough and chest pain at PAT appointment  Patient verbalized understanding of instructions that were given to them at the PAT appointment. Patient was also instructed that they will need to review over the PAT instructions again at home before surgery.

## 2022-11-04 NOTE — Patient Instructions (Addendum)
SURGICAL WAITING ROOM VISITATION  Patients having surgery or a procedure may have no more than 2 support people in the waiting area - these visitors may rotate.    Children under the age of 57 must have an adult with them who is not the patient.  Due to an increase in RSV and influenza rates and associated hospitalizations, children ages 14 and under may not visit patients in [place].  If the patient needs to stay at the hospital during part of their recovery, the visitor guidelines for inpatient rooms apply. Pre-op nurse will coordinate an appropriate time for 1 support person to accompany patient in pre-op.  This support person may not rotate.    Please refer to the [facility] website for the visitor guidelines for Inpatients (after your surgery is over and you are in a regular room).     Your procedure is scheduled on: 11/15/22   Report to [facility] Main Entrance    Report to admitting at 1:55 PM   Call this number if you have problems the morning of surgery [phone]   Do not eat food :After Midnight.   After Midnight you may have the following liquids until 1:10 PM DAY OF SURGERY  Water Non-Citrus Juices (without pulp, NO RED-Apple, White grape, White cranberry) Black Coffee (NO MILK/CREAM OR CREAMERS, sugar ok)  Clear Tea (NO MILK/CREAM OR CREAMERS, sugar ok) regular and decaf                             Plain Jell-O (NO RED)                                           Fruit ices (not with fruit pulp, NO RED)                                     Popsicles (NO RED)                                                               Sports drinks like Gatorade (NO RED)    The day of surgery:  Drink ONE (1) Pre-Surgery Clear Ensure at 1:10 PM the morning of surgery. Drink in one sitting. Do not sip.  This drink was given to you during your hospital  pre-op appointment visit. Nothing else to drink after completing the  Pre-Surgery Clear Ensure.          If  you have questions, please contact your surgeon's office.   FOLLOW BOWEL PREP AND ANY ADDITIONAL PRE OP INSTRUCTIONS YOU RECEIVED FROM YOUR SURGEON'S OFFICE!!!     Oral Hygiene is also important to reduce your risk of infection.                                    Remember - BRUSH YOUR TEETH THE MORNING OF SURGERY WITH YOUR REGULAR TOOTHPASTE  DENTURES WILL BE REMOVED PRIOR TO SURGERY PLEASE DO NOT APPLY "Poly grip" OR  ADHESIVES!!!   Take these medicines the morning of surgery with A SIP OF WATER: Amlodipine              You may not have any metal on your body including jewelry, and body piercing             Do not wear lotions, powders, cologne, or deodorant  Do not shave  48 hours prior to surgery.               Men may shave face and neck.   Do not bring valuables to the hospital. [place].   Contacts, glasses, dentures or bridgework may not be worn into surgery.  DO NOT [place]. PHARMACY WILL DISPENSE MEDICATIONS LISTED ON YOUR MEDICATION LIST TO YOU DURING YOUR ADMISSION [place]!    Patients discharged on the day of surgery will not be allowed to drive home.  Someone NEEDS to stay with you for the first 24 hours after anesthesia.              Please read over the following fact sheets you were given: IF [place] [phone] Schneiders    If you received a COVID test during your pre-op visit  it is requested that you wear a mask when out in public, stay away from anyone that may not be feeling well and notify your surgeon if you develop symptoms. If you test positive for Covid or have been in contact with anyone that has tested positive in the last 10 days please notify you surgeon.    [place] - Preparing for Surgery Before surgery, you can play an important role.  Because skin is not sterile, your skin needs to be as free of  germs as possible.  You can reduce the number of germs on your skin by washing with CHG (chlorahexidine gluconate) soap before surgery.  CHG is an antiseptic cleaner which kills germs and bonds with the skin to continue killing germs even after washing. Please DO NOT use if you have an allergy to CHG or antibacterial soaps.  If your skin becomes reddened/irritated stop using the CHG and inform your nurse when you arrive at Short Stay. Do not shave (including legs and underarms) for at least 48 hours prior to the first CHG shower.  You may shave your face/neck.  Please follow these instructions carefully:  1.  Shower with CHG Soap the night before surgery and the  morning of surgery.  2.  If you choose to wash your hair, wash your hair first as usual with your normal  shampoo.  3.  After you shampoo, rinse your hair and body thoroughly to remove the shampoo.                             4.  Use CHG as you would any other liquid soap.  You can apply chg directly to the skin and wash.  Gently with a scrungie or clean washcloth.  5.  Apply the CHG Soap to your body ONLY FROM THE NECK DOWN.   Do   not use on face/ open  Wound or open sores. Avoid contact with eyes, ears mouth and   genitals (private parts).                       Wash face,  Genitals (private parts) with your normal soap.             6.  Wash thoroughly, paying special attention to the area where your    surgery  will be performed.  7.  Thoroughly rinse your body with warm water from the neck down.  8.  DO NOT shower/wash with your normal soap after using and rinsing off the CHG Soap.                9.  Pat yourself dry with a clean towel.            10.  Wear clean pajamas.            11.  Place clean sheets on your bed the night of your first shower and do not  sleep with pets. Day of Surgery : Do not apply any lotions/deodorants the morning of surgery.  Please wear clean clothes to the hospital/surgery  center.  FAILURE TO FOLLOW THESE INSTRUCTIONS MAY RESULT IN THE CANCELLATION OF YOUR SURGERY  PATIENT SIGNATURE_________________________________  NURSE SIGNATURE__________________________________  ________________________________________________________________________  Adam Phenix  An incentive spirometer is a tool that can help keep your lungs clear and active. This tool measures how well you are filling your lungs with each breath. Taking long deep breaths may help reverse or decrease the chance of developing breathing (pulmonary) problems (especially infection) following: A long period of time when you are unable to move or be active. BEFORE THE PROCEDURE  If the spirometer includes an indicator to show your best effort, your nurse or respiratory therapist will set it to a desired goal. If possible, sit up straight or lean slightly forward. Try not to slouch. Hold the incentive spirometer in an upright position. INSTRUCTIONS FOR USE  Sit on the edge of your bed if possible, or sit up as far as you can in bed or on a chair. Hold the incentive spirometer in an upright position. Breathe out normally. Place the mouthpiece in your mouth and seal your lips tightly around it. Breathe in slowly and as deeply as possible, raising the piston or the ball toward the top of the column. Hold your breath for 3-5 seconds or for as long as possible. Allow the piston or ball to fall to the bottom of the column. Remove the mouthpiece from your mouth and breathe out normally. Rest for a few seconds and repeat Steps 1 through 7 at least 10 times every 1-2 hours when you are awake. Take your time and take a few normal breaths between deep breaths. The spirometer may include an indicator to show your best effort. Use the indicator as a goal to work toward during each repetition. After each set of 10 deep breaths, practice coughing to be sure your lungs are clear. If you have an incision (the cut  made at the time of surgery), support your incision when coughing by placing a pillow or rolled up towels firmly against it. Once you are able to get out of bed, walk around indoors and cough well. You may stop using the incentive spirometer when instructed by your caregiver.  RISKS AND COMPLICATIONS Take your time so you do not get dizzy or light-headed. If you are in pain, you may  need to take or ask for pain medication before doing incentive spirometry. It is harder to take a deep breath if you are having pain. AFTER USE Rest and breathe slowly and easily. It can be helpful to keep track of a log of your progress. Your caregiver can provide you with a simple table to help with this. If you are using the spirometer at home, follow these instructions: [place] IF:  You are having difficultly using the spirometer. You have trouble using the spirometer as often as instructed. Your pain medication is not giving enough relief while using the spirometer. You develop fever of 100.5 F (38.1 C) or higher. SEEK IMMEDIATE MEDICAL CARE IF:  You cough up bloody sputum that had not been present before. You develop fever of 102 F (38.9 C) or greater. You develop worsening pain at or near the incision site. MAKE SURE YOU:  Understand these instructions. Will watch your condition. Will get help right away if you are not doing well or get worse. Document Released: 02/14/2007 Document Revised: 12/27/2011 Document Reviewed: 04/17/2007 [facility] Patient Information 2014 [place], [place].   ________________________________________________________________________

## 2022-11-05 ENCOUNTER — Encounter (HOSPITAL_COMMUNITY)
Admission: RE | Admit: 2022-11-05 | Discharge: 2022-11-05 | Disposition: A | Discharge status: Home / Self Care | Payer: No Typology Code available for payment source | Source: Ambulatory Visit | Attending: Orthopedic Surgery | Admitting: Orthopedic Surgery

## 2022-11-05 ENCOUNTER — Encounter (HOSPITAL_COMMUNITY): Payer: Self-pay

## 2022-11-05 VITALS — BP 171/107 | HR 90 | Temp 98.4°F | Resp 18 | Ht 74.0 in | Wt 379.0 lb

## 2022-11-05 DIAGNOSIS — I10 Essential (primary) hypertension: Secondary | ICD-10-CM | POA: Diagnosis not present

## 2022-11-05 DIAGNOSIS — Z01812 Encounter for preprocedural laboratory examination: Secondary | ICD-10-CM | POA: Insufficient documentation

## 2022-11-05 HISTORY — DX: Unspecified osteoarthritis, unspecified site: M19.90

## 2022-11-05 LAB — CBC
HCT: 40.8 % (ref 39.0–52.0)
Hemoglobin: 12.6 g/dL — ABNORMAL LOW (ref 13.0–17.0)
MCH: 25.4 pg — ABNORMAL LOW (ref 26.0–34.0)
MCHC: 30.9 g/dL (ref 30.0–36.0)
MCV: 82.3 fL (ref 80.0–100.0)
Platelets: 358 10*3/uL (ref 150–400)
RBC: 4.96 MIL/uL (ref 4.22–5.81)
RDW: 16.1 % — ABNORMAL HIGH (ref 11.5–15.5)
WBC: 9.8 10*3/uL (ref 4.0–10.5)
nRBC: 0 % (ref 0.0–0.2)

## 2022-11-05 LAB — BASIC METABOLIC PANEL
Anion gap: 11 (ref 5–15)
BUN: 19 mg/dL (ref 6–20)
CO2: 26 mmol/L (ref 22–32)
Calcium: 8.8 mg/dL — ABNORMAL LOW (ref 8.9–10.3)
Chloride: 99 mmol/L (ref 98–111)
Creatinine, Ser: 1.1 mg/dL (ref 0.61–1.24)
GFR, Estimated: >60 mL/min (ref >60)
Glucose, Bld: 125 mg/dL — ABNORMAL HIGH (ref 70–99)
Potassium: 3.3 mmol/L — ABNORMAL LOW (ref 3.5–5.1)
Sodium: 136 mmol/L (ref 135–145)

## 2022-11-05 NOTE — Progress Notes (Signed)
   11/05/22 0922  OBSTRUCTIVE SLEEP APNEA  Have you ever been diagnosed with sleep apnea through a sleep study? No  Do you snore loudly (loud enough to be heard through closed doors)?  1  Do you often feel tired, fatigued, or sleepy during the daytime (such as falling asleep during driving or talking to someone)? 0  Has anyone observed you stop breathing during your sleep? 0  Do you have, or are you being treated for high blood pressure? 1  BMI more than 35 kg/m2? 1  Age > 50 (1-yes) 1  Neck circumference greater than:Male 16 inches or larger, Male 17inches or larger? 0  Male Gender (Yes=1) 1  Obstructive Sleep Apnea Score 5  Score 5 or greater  Results sent to PCP

## 2022-11-11 DIAGNOSIS — S83242A Other tear of medial meniscus, current injury, left knee, initial encounter: Secondary | ICD-10-CM | POA: Diagnosis present

## 2022-11-11 NOTE — H&P (Signed)
Timothy Dalton is an 57 y.o. male.   Chief Complaint: Left Knee Pain  HPI: Timothy Dalton is here today for follow-up of his left knee that underwent cortisone injection on 07/21/22 for an MRI proven flap tear of the medial meniscus as well as chondromalacia of the patella the meniscal tear was caused or exacerbated by an injury and work on 06/24/22.  Patient reports that the injection provided 1 day worth of pain relief and his pain has returned.  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  Past Medical History:  Diagnosis Date   Arthritis    Gallstone    HTN (hypertension)    Pancreatitis     Past Surgical History:  Procedure Laterality Date   CHOLECYSTECTOMY N/A 04/30/2016   Procedure: LAPAROSCOPIC CHOLECYSTECTOMY;  Surgeon: Timothy Epley, MD;  Location: [facility];  Service: General;  Laterality: N/A;    Family History  Problem Relation Age of Onset   Colon cancer Neg Hx    Pancreatic disease Neg Hx    Liver disease Neg Hx    Colon polyps Neg Hx    Social History:  reports that he quit smoking about 6 months ago. His smoking use included cigarettes. He smoked an average of .25 packs per day. He has never used smokeless tobacco. He reports that he does not currently use alcohol. He reports that he does not use drugs.  Allergies: No Known Allergies  No medications prior to admission.    No results found for this or any previous visit (from the past 48 hour(s)). No results found.  Review of Systems  Constitutional: Negative.   HENT: Negative.    Eyes: Negative.   Respiratory: Negative.    Cardiovascular:        HTN  Gastrointestinal: Negative.   Endocrine: Negative.   Genitourinary: Negative.   Musculoskeletal:  Positive for arthralgias and joint swelling.  Allergic/Immunologic: Negative.   Neurological: Negative.   Hematological: Negative.   Psychiatric/Behavioral: Negative.      There were no vitals taken for this visit. Physical  Exam Constitutional:      Appearance: Normal appearance. He is obese.  HENT:     Head: Normocephalic and atraumatic.     Nose: Nose normal.  Eyes:     Pupils: Pupils are equal, round, and reactive to light.  Cardiovascular:     Pulses: Normal pulses.  Pulmonary:     Effort: Pulmonary effort is normal.  Musculoskeletal:        General: Tenderness present.     Cervical back: Normal range of motion and neck supple.     Comments: Tender along the medial joint line of the left knee.  Nontender to palpation along the patella or the patellar retinacula.  No pain on full extension when she flex past 100 reproduces medial and posterior pain.  He walks with a left-sided limp.    Skin:    General: Skin is warm and dry.  Neurological:     General: No focal deficit present.     Mental Status: He is alert and oriented to person, place, and time. Mental status is at baseline.  Psychiatric:        Mood and Affect: Mood normal.        Behavior: Behavior normal.        Thought Content: Thought content normal.        Judgment: Judgment normal.      Assessment/Plan Assess:  Symptomatic medial meniscal tear left knee caused or exacerbated by injury at work  Plan: Wrist benefits of arthroscopic surgery discussed at length with the patient and his case manager was in the room.  We will try to get this set up surgical [place].  We may have to do at the hospital as his body weight is a little over 350 pounds although his BMI is only 44.  He has no history of heart disease lung disease diabetes or sleep apnea.  Posting slip is been filled out.  I also wrote a prescription for crutches to be used after surgery.  He can anticipate 4 weeks of physical therapy after surgery.  I will see him back at the time of surgical intervention.  Timothy Rising, PA-C 11/11/2022, 4:07 PM

## 2022-11-14 MED ORDER — TRANEXAMIC ACID 1000 MG/10ML IV SOLN
2000.0000 mg | INTRAVENOUS | Status: DC
  Filled 2022-11-14: qty 20

## 2022-11-15 ENCOUNTER — Ambulatory Visit (HOSPITAL_COMMUNITY)
Admission: RE | Admit: 2022-11-15 | Discharge: 2022-11-15 | Disposition: A | Discharge status: Home / Self Care | Payer: No Typology Code available for payment source | Source: Ambulatory Visit | Attending: Orthopedic Surgery | Admitting: Orthopedic Surgery

## 2022-11-15 ENCOUNTER — Encounter (HOSPITAL_COMMUNITY): Payer: Self-pay | Admitting: Orthopedic Surgery

## 2022-11-15 ENCOUNTER — Ambulatory Visit (HOSPITAL_COMMUNITY): Payer: No Typology Code available for payment source | Admitting: Physician Assistant

## 2022-11-15 ENCOUNTER — Other Ambulatory Visit: Payer: Self-pay

## 2022-11-15 ENCOUNTER — Encounter (HOSPITAL_COMMUNITY)
Admission: RE | Disposition: A | Discharge status: Home / Self Care | Payer: Self-pay | Source: Ambulatory Visit | Attending: Orthopedic Surgery

## 2022-11-15 ENCOUNTER — Ambulatory Visit (HOSPITAL_BASED_OUTPATIENT_CLINIC_OR_DEPARTMENT_OTHER): Payer: No Typology Code available for payment source | Admitting: Certified Registered Nurse Anesthetist

## 2022-11-15 DIAGNOSIS — S83242A Other tear of medial meniscus, current injury, left knee, initial encounter: Secondary | ICD-10-CM

## 2022-11-15 DIAGNOSIS — Y990 Civilian activity done for income or pay: Secondary | ICD-10-CM | POA: Diagnosis not present

## 2022-11-15 DIAGNOSIS — X58XXXA Exposure to other specified factors, initial encounter: Secondary | ICD-10-CM | POA: Insufficient documentation

## 2022-11-15 DIAGNOSIS — Z87891 Personal history of nicotine dependence: Secondary | ICD-10-CM | POA: Diagnosis not present

## 2022-11-15 DIAGNOSIS — I10 Essential (primary) hypertension: Secondary | ICD-10-CM | POA: Insufficient documentation

## 2022-11-15 DIAGNOSIS — S83282A Other tear of lateral meniscus, current injury, left knee, initial encounter: Secondary | ICD-10-CM | POA: Insufficient documentation

## 2022-11-15 DIAGNOSIS — M94262 Chondromalacia, left knee: Secondary | ICD-10-CM | POA: Insufficient documentation

## 2022-11-15 DIAGNOSIS — Z6841 Body Mass Index (BMI) 40.0 and over, adult: Secondary | ICD-10-CM | POA: Insufficient documentation

## 2022-11-15 DIAGNOSIS — M1990 Unspecified osteoarthritis, unspecified site: Secondary | ICD-10-CM | POA: Diagnosis not present

## 2022-11-15 HISTORY — PX: KNEE ARTHROSCOPY: SHX127

## 2022-11-15 SURGERY — ARTHROSCOPY, KNEE
Anesthesia: General | Site: Knee | Laterality: Left

## 2022-11-15 MED ORDER — LACTATED RINGERS IV SOLN: INTRAVENOUS | Status: DC

## 2022-11-15 MED ORDER — CHLORHEXIDINE GLUCONATE 0.12 % MT SOLN
15.0000 mL | Freq: Once | OROMUCOSAL | Status: AC
  Administered 2022-11-15: 15 mL via OROMUCOSAL

## 2022-11-15 MED ORDER — ORAL CARE MOUTH RINSE: 15.0000 mL | Freq: Once | OROMUCOSAL | Status: AC

## 2022-11-15 MED ORDER — TRANEXAMIC ACID-NACL 1000-0.7 MG/100ML-% IV SOLN
1000.0000 mg | INTRAVENOUS | Status: AC
  Administered 2022-11-15: 1000 mg via INTRAVENOUS
  Filled 2022-11-15: qty 100

## 2022-11-15 MED ORDER — MIDAZOLAM HCL 5 MG/5ML IJ SOLN
INTRAMUSCULAR | Status: DC | PRN
  Administered 2022-11-15: 2 mg via INTRAVENOUS

## 2022-11-15 MED ORDER — PROPOFOL 500 MG/50ML IV EMUL
INTRAVENOUS | Status: AC
  Filled 2022-11-15: qty 50

## 2022-11-15 MED ORDER — PHENYLEPHRINE 80 MCG/ML (10ML) SYRINGE FOR IV PUSH (FOR BLOOD PRESSURE SUPPORT)
PREFILLED_SYRINGE | INTRAVENOUS | Status: DC | PRN
  Administered 2022-11-15 (×2): 160 ug via INTRAVENOUS

## 2022-11-15 MED ORDER — ONDANSETRON HCL 4 MG/2ML IJ SOLN: 4.0000 mg | Freq: Once | INTRAMUSCULAR | Status: DC | PRN

## 2022-11-15 MED ORDER — DEXAMETHASONE SODIUM PHOSPHATE 10 MG/ML IJ SOLN
INTRAMUSCULAR | Status: DC | PRN
  Administered 2022-11-15: 10 mg via INTRAVENOUS

## 2022-11-15 MED ORDER — DEXAMETHASONE SODIUM PHOSPHATE 10 MG/ML IJ SOLN
INTRAMUSCULAR | Status: AC
  Filled 2022-11-15: qty 1

## 2022-11-15 MED ORDER — BUPIVACAINE-EPINEPHRINE 0.5% -1:200000 IJ SOLN
INTRAMUSCULAR | Status: DC | PRN
  Administered 2022-11-15: 30 mL

## 2022-11-15 MED ORDER — ONDANSETRON HCL 4 MG/2ML IJ SOLN
INTRAMUSCULAR | Status: DC | PRN
  Administered 2022-11-15: 4 mg via INTRAVENOUS

## 2022-11-15 MED ORDER — OXYCODONE HCL 5 MG/5ML PO SOLN: 5.0000 mg | Freq: Once | ORAL | Status: DC | PRN

## 2022-11-15 MED ORDER — BUPIVACAINE HCL (PF) 0.5 % IJ SOLN
INTRAMUSCULAR | Status: AC
  Filled 2022-11-15: qty 30

## 2022-11-15 MED ORDER — LIDOCAINE 2% (20 MG/ML) 5 ML SYRINGE
INTRAMUSCULAR | Status: DC | PRN
  Administered 2022-11-15: 100 mg via INTRAVENOUS

## 2022-11-15 MED ORDER — PHENYLEPHRINE 80 MCG/ML (10ML) SYRINGE FOR IV PUSH (FOR BLOOD PRESSURE SUPPORT)
PREFILLED_SYRINGE | INTRAVENOUS | Status: AC
  Filled 2022-11-15: qty 10

## 2022-11-15 MED ORDER — PROPOFOL 10 MG/ML IV BOLUS
INTRAVENOUS | Status: DC | PRN
  Administered 2022-11-15: 350 mg via INTRAVENOUS

## 2022-11-15 MED ORDER — FENTANYL CITRATE (PF) 100 MCG/2ML IJ SOLN
INTRAMUSCULAR | Status: DC | PRN
  Administered 2022-11-15: 100 ug via INTRAVENOUS

## 2022-11-15 MED ORDER — OXYCODONE HCL 5 MG PO TABS: 5.0000 mg | ORAL_TABLET | Freq: Once | ORAL | Status: DC | PRN

## 2022-11-15 MED ORDER — EPINEPHRINE PF 1 MG/ML IJ SOLN
INTRAMUSCULAR | Status: AC
  Filled 2022-11-15: qty 3

## 2022-11-15 MED ORDER — LIDOCAINE HCL (PF) 2 % IJ SOLN
INTRAMUSCULAR | Status: AC
  Filled 2022-11-15: qty 5

## 2022-11-15 MED ORDER — HYDROCODONE-ACETAMINOPHEN 5-325 MG PO TABS: 1.0000 | ORAL_TABLET | ORAL | 0 refills | Status: DC | PRN

## 2022-11-15 MED ORDER — CEFAZOLIN IN SODIUM CHLORIDE 3-0.9 GM/100ML-% IV SOLN
3.0000 g | INTRAVENOUS | Status: AC
  Administered 2022-11-15: 3 g via INTRAVENOUS
  Filled 2022-11-15: qty 100

## 2022-11-15 MED ORDER — FENTANYL CITRATE PF 50 MCG/ML IJ SOSY: 25.0000 ug | PREFILLED_SYRINGE | INTRAMUSCULAR | Status: DC | PRN

## 2022-11-15 MED ORDER — EPINEPHRINE PF 1 MG/ML IJ SOLN
INTRAMUSCULAR | Status: DC | PRN
  Administered 2022-11-15: 1 mg

## 2022-11-15 SURGICAL SUPPLY — 47 items
APL PRP STRL LF DISP 70% ISPRP (MISCELLANEOUS) ×1
BAG COUNTER SPONGE SURGICOUNT (BAG) IMPLANT
BAG SPEC THK2 15X12 ZIP CLS (MISCELLANEOUS) ×1
BAG SPNG CNTER NS LX DISP (BAG)
BAG ZIPLOCK 12X15 (MISCELLANEOUS) ×1 IMPLANT
BLADE SURG SZ11 CARB STEEL (BLADE) IMPLANT
BNDG ELASTIC 6X10 VLCR STRL LF (GAUZE/BANDAGES/DRESSINGS) IMPLANT
BNDG ELASTIC 6X5.8 VLCR STR LF (GAUZE/BANDAGES/DRESSINGS) IMPLANT
CHLORAPREP W/TINT 26 (MISCELLANEOUS) ×1 IMPLANT
COVER SURGICAL LIGHT HANDLE (MISCELLANEOUS) ×1 IMPLANT
CUFF TOURN SGL QUICK 34 (TOURNIQUET CUFF) ×1
CUFF TRNQT CYL 34X4.125X (TOURNIQUET CUFF) ×1 IMPLANT
DISSECTOR  3.8MM X 13CM (MISCELLANEOUS) ×1
DISSECTOR 3.8MM X 13CM (MISCELLANEOUS) IMPLANT
DISSECTOR 4.0MM X 13CM (MISCELLANEOUS) ×1 IMPLANT
DRAPE SHEET LG 3/4 BI-LAMINATE (DRAPES) ×1 IMPLANT
EXCALIBUR 3.8MM X 13CM (MISCELLANEOUS) IMPLANT
GAUZE 4X4 16PLY ~~LOC~~+RFID DBL (SPONGE) ×1 IMPLANT
GAUZE PAD ABD 8X10 STRL (GAUZE/BANDAGES/DRESSINGS) IMPLANT
GAUZE SPONGE 4X4 12PLY STRL (GAUZE/BANDAGES/DRESSINGS) IMPLANT
GAUZE XEROFORM 1X8 LF (GAUZE/BANDAGES/DRESSINGS) IMPLANT
GLOVE BIO SURGEON STRL SZ7 (GLOVE) ×1 IMPLANT
GLOVE BIO SURGEON STRL SZ7.5 (GLOVE) ×1 IMPLANT
GLOVE BIO SURGEON STRL SZ8.5 (GLOVE) ×1 IMPLANT
GLOVE BIOGEL PI IND STRL 7.0 (GLOVE) ×1 IMPLANT
GLOVE BIOGEL PI IND STRL 8 (GLOVE) ×1 IMPLANT
GLOVE BIOGEL PI IND STRL 9 (GLOVE) ×1 IMPLANT
GLOVE INDICATOR 8.0 STRL GRN (GLOVE) ×1 IMPLANT
GOWN STRL REUS W/ TWL XL LVL3 (GOWN DISPOSABLE) ×2 IMPLANT
GOWN STRL REUS W/TWL XL LVL3 (GOWN DISPOSABLE) ×2
IRRIG SUCT STRYKERFLOW 2 WTIP (MISCELLANEOUS) ×1
IRRIGATION SUCT STRKRFLW 2 WTP (MISCELLANEOUS) ×1 IMPLANT
KIT BASIN OR (CUSTOM PROCEDURE TRAY) IMPLANT
MANIFOLD NEPTUNE II (INSTRUMENTS) ×1 IMPLANT
NDL SAFETY ECLIP 18X1.5 (MISCELLANEOUS) ×1 IMPLANT
NDL SPNL 18GX3.5 QUINCKE PK (NEEDLE) ×1 IMPLANT
NEEDLE HYPO 22GX1.5 SAFETY (NEEDLE) ×1 IMPLANT
NEEDLE SPNL 18GX3.5 QUINCKE PK (NEEDLE) ×1 IMPLANT
PACK ARTHROSCOPY WL (CUSTOM PROCEDURE TRAY) ×1 IMPLANT
PADDING CAST COTTON 6X4 STRL (CAST SUPPLIES) IMPLANT
PROTECTOR NERVE ULNAR (MISCELLANEOUS) ×1 IMPLANT
SPONGE T-LAP 4X18 ~~LOC~~+RFID (SPONGE) ×1 IMPLANT
SYR 20ML LL LF (SYRINGE) ×1 IMPLANT
SYR BULB IRRIG 60ML STRL (SYRINGE) ×1 IMPLANT
TOWEL OR 17X26 10 PK STRL BLUE (TOWEL DISPOSABLE) ×1 IMPLANT
TUBING ARTHROSCOPY IRRIG 16FT (MISCELLANEOUS) ×1 IMPLANT
WRAP KNEE MAXI GEL POST OP (GAUZE/BANDAGES/DRESSINGS) ×1 IMPLANT

## 2022-11-15 NOTE — Anesthesia Postprocedure Evaluation (Signed)
Anesthesia Post Note  Patient: Timothy Dalton  Procedure(s) Performed: LEFT KNEE ARTHROSCOPY with partial medial and lateral menisectomy (Left: Knee)     Patient location during evaluation: PACU Anesthesia Type: General Level of consciousness: awake and alert and oriented Pain management: pain level controlled Vital Signs Assessment: post-procedure vital signs reviewed and stable Respiratory status: spontaneous breathing, nonlabored ventilation and respiratory function stable Cardiovascular status: blood pressure returned to baseline and stable Postop Assessment: no apparent nausea or vomiting Anesthetic complications: no   No notable events documented.  Last Vitals:  Vitals:   11/15/22 1415 11/15/22 1430  BP: 118/77 123/79  Pulse: 84 79  Resp: 16 12  Temp:  36.7 C  SpO2: 92% 98%    Last Pain:  Vitals:   11/15/22 1430  TempSrc:   PainSc: 0-No pain                 Kace Hartje A.

## 2022-11-15 NOTE — Interval H&P Note (Signed)
History and Physical Interval Note:  11/15/2022 10:03 AM  Timothy Dalton  has presented today for surgery, with the diagnosis of LEFT KNEE MEDIAL MENISCUS [place].  The various methods of treatment have been discussed with the patient and family. After consideration of risks, benefits and other options for treatment, the patient has consented to  Procedure(s): LEFT KNEE ARTHROSCOPY (Left) as a surgical intervention.  The patient's history has been reviewed, patient examined, no change in status, stable for surgery.  I have reviewed the patient's chart and labs.  Questions were answered to the patient's satisfaction.     Kerin Salen

## 2022-11-15 NOTE — Transfer of Care (Signed)
Immediate Anesthesia Transfer of Care Note  Patient: Timothy Dalton  Procedure(s) Performed: LEFT KNEE ARTHROSCOPY with partial medial and lateral menisectomy (Left: Knee)  Patient Location: PACU  Anesthesia Type:General  Level of Consciousness: awake, alert , and oriented  Airway & Oxygen Therapy: Patient Spontanous Breathing and Patient connected to face mask oxygen  Post-op Assessment: Report given to RN and Post -op Vital signs reviewed and stable  Post vital signs: Reviewed and stable  Last Vitals:  Vitals Value Taken Time  BP    Temp    Pulse    Resp    SpO2      Last Pain:  Vitals:   11/15/22 1005  TempSrc:   PainSc: 0-No pain         Complications: No notable events documented.

## 2022-11-15 NOTE — Anesthesia Procedure Notes (Signed)
Procedure Name: LMA Insertion Date/Time: 11/15/2022 12:54 PM  Performed by: Maxwell Caul, CRNAPre-anesthesia Checklist: Patient identified, Emergency Drugs available, Suction available and Patient being monitored Patient Re-evaluated:Patient Re-evaluated prior to induction Oxygen Delivery Method: Circle system utilized Preoxygenation: Pre-oxygenation with 100% oxygen Induction Type: IV induction LMA: LMA with gastric port inserted LMA Size: 5.0 Number of attempts: 1 Placement Confirmation: positive ETCO2 and breath sounds checked- equal and bilateral Tube secured with: Tape Dental Injury: Teeth and Oropharynx as per pre-operative assessment

## 2022-11-15 NOTE — OR Nursing (Signed)
Pt discharged with a walker with wheels.

## 2022-11-15 NOTE — Op Note (Signed)
Pre-Op Dx: Left knee anterior horn medial meniscal tear  Postop Dx: Left knee anterior horn medial meniscal tear, lateral meniscal tear posterior and lateral horns  Procedure: Left knee partial arthroscopic medial and lateral meniscectomies  Surgeon: Kathalene Frames. Mayer Camel M.D.  Assist: Kerry Hough. Barton Dubois  (present throughout entire procedure and necessary for timely completion of the procedure) Anes: General LMA  EBL: Minimal  Fluids: 800 cc   Indications: Patient is catching popping and pain in his left knee.  MRI scan showed some chondromalacia of the medial compartment and an anterior horn medial meniscal tear.. Pt has failed conservative treatment with anti-inflammatory medicines, physical therapy, and modified activites but did get good temporarily from an intra-articular cortisone injection. Pain has recurred and patient desires elective arthroscopic evaluation and treatment of knee. Risks and benefits of surgery have been discussed and questions answered.  Procedure: Patient identified by arm band and taken to the operating room at the day surgery Center. The appropriate anesthetic monitors were attached, and General LMA anesthesia was induced without difficulty. Lateral post was applied to the table and the lower extremity was prepped and draped in usual sterile fashion from the ankle to the midthigh. Time out procedure was performed. We began the operation by making standard inferior lateral and inferior medial peripatellar portals with a #11 blade allowing introduction of the arthroscope through the inferior lateral portal and the out flow to the inferior medial portal. Pump pressure was set at 100 mmHg and diagnostic arthroscopy  revealed mild chondromalacia of the patellofemoral joint lightly debrided.  Moving into the medial compartment there was an anterior horn medial meniscal tear and this was debrided back to a stable margin using the Arthrex 3.8 mm dissector.  The articular cartilage of  the medial compartment was in excellent condition.  Moving into the notch the ACL and the PCL were intact.  The leg was then placed in the figure-of-four position allowing [place] to visualize the lateral compartment and the patient did have tearing of the inner rim of the lateral meniscus from the posterior horn to the lateral horn.  Again debrided back to a stable margin with a 3.8 mm Arthrex dissector sucker shaver.  The gutters were cleared medially and laterally.. The knee was irrigated out normal saline solution. A dressing of xerofoam 4 x 4 dressing sponges, web roll and an Ace wrap was applied. The patient was awakened extubated and taken to the recovery without difficulty.    Signed: Kerin Salen, MD

## 2022-11-15 NOTE — Anesthesia Preprocedure Evaluation (Addendum)
Anesthesia Evaluation  Patient identified by MRN, date of birth, ID band Patient awake    Reviewed: Allergy & Precautions, NPO status , Patient's Chart, lab work & pertinent test results  Airway Mallampati: III  TM Distance: >3 FB Neck ROM: Full    Dental  (+) Dental Advisory Given, Poor Dentition, Missing, Loose,    Pulmonary Patient abstained from smoking., former smoker   Pulmonary exam normal breath sounds clear to auscultation       Cardiovascular hypertension, Pt. on medications Normal cardiovascular exam Rhythm:Regular Rate:Normal     Neuro/Psych negative neurological ROS  negative psych ROS   GI/Hepatic negative GI ROS, Neg liver ROS,,,  Endo/Other    Morbid obesity  Renal/GU negative Renal ROS  negative genitourinary   Musculoskeletal  (+) Arthritis , Osteoarthritis,  TMM left knee   Abdominal  (+) + obese  Peds  Hematology negative hematology ROS (+)   Anesthesia Other Findings   Reproductive/Obstetrics                             Anesthesia Physical Anesthesia Plan  ASA: 3  Anesthesia Plan: General   Post-op Pain Management: Minimal or no pain anticipated, Regional block* and Dilaudid IV   Induction: Intravenous  PONV Risk Score and Plan: 3 and Treatment may vary due to age or medical condition and Ondansetron  Airway Management Planned: LMA  Additional Equipment: None  Intra-op Plan:   Post-operative Plan: Extubation in OR  Informed Consent: I have reviewed the patients History and Physical, chart, labs and discussed the procedure including the risks, benefits and alternatives for the proposed anesthesia with the patient or authorized representative who has indicated his/her understanding and acceptance.     Dental advisory given  Plan Discussed with: CRNA and Anesthesiologist  Anesthesia Plan Comments:         Anesthesia Quick Evaluation

## 2022-11-16 ENCOUNTER — Encounter (HOSPITAL_COMMUNITY): Payer: Self-pay | Admitting: Orthopedic Surgery

## 2023-03-05 ENCOUNTER — Inpatient Hospital Stay (HOSPITAL_COMMUNITY)
Admission: EM | Admit: 2023-03-05 | Discharge: 2023-03-06 | DRG: 638 | Disposition: A | Discharge status: Home / Self Care | Payer: Self-pay | Attending: Internal Medicine | Admitting: Internal Medicine

## 2023-03-05 ENCOUNTER — Encounter (HOSPITAL_COMMUNITY): Payer: Self-pay

## 2023-03-05 ENCOUNTER — Other Ambulatory Visit: Payer: Self-pay

## 2023-03-05 DIAGNOSIS — I10 Essential (primary) hypertension: Secondary | ICD-10-CM | POA: Diagnosis present

## 2023-03-05 DIAGNOSIS — E1010 Type 1 diabetes mellitus with ketoacidosis without coma: Principal | ICD-10-CM

## 2023-03-05 DIAGNOSIS — E872 Acidosis, unspecified: Secondary | ICD-10-CM | POA: Diagnosis present

## 2023-03-05 DIAGNOSIS — Z6841 Body Mass Index (BMI) 40.0 and over, adult: Secondary | ICD-10-CM

## 2023-03-05 DIAGNOSIS — G4733 Obstructive sleep apnea (adult) (pediatric): Secondary | ICD-10-CM | POA: Diagnosis present

## 2023-03-05 DIAGNOSIS — R350 Frequency of micturition: Secondary | ICD-10-CM | POA: Diagnosis present

## 2023-03-05 DIAGNOSIS — M25519 Pain in unspecified shoulder: Secondary | ICD-10-CM | POA: Diagnosis present

## 2023-03-05 DIAGNOSIS — E1165 Type 2 diabetes mellitus with hyperglycemia: Principal | ICD-10-CM | POA: Diagnosis present

## 2023-03-05 DIAGNOSIS — Z87891 Personal history of nicotine dependence: Secondary | ICD-10-CM

## 2023-03-05 DIAGNOSIS — E119 Type 2 diabetes mellitus without complications: Secondary | ICD-10-CM

## 2023-03-05 DIAGNOSIS — E1110 Type 2 diabetes mellitus with ketoacidosis without coma: Secondary | ICD-10-CM | POA: Diagnosis present

## 2023-03-05 DIAGNOSIS — Z79899 Other long term (current) drug therapy: Secondary | ICD-10-CM

## 2023-03-05 LAB — BASIC METABOLIC PANEL
Anion gap: 21 — ABNORMAL HIGH (ref 5–15)
Anion gap: 18 — ABNORMAL HIGH (ref 5–15)
BUN: 14 mg/dL (ref 6–20)
BUN: 13 mg/dL (ref 6–20)
CO2: 17 mmol/L — ABNORMAL LOW (ref 22–32)
CO2: 18 mmol/L — ABNORMAL LOW (ref 22–32)
Calcium: 8.8 mg/dL — ABNORMAL LOW (ref 8.9–10.3)
Calcium: 9 mg/dL (ref 8.9–10.3)
Chloride: 89 mmol/L — ABNORMAL LOW (ref 98–111)
Chloride: 94 mmol/L — ABNORMAL LOW (ref 98–111)
Creatinine, Ser: 1.35 mg/dL — ABNORMAL HIGH (ref 0.61–1.24)
Creatinine, Ser: 1.18 mg/dL (ref 0.61–1.24)
GFR, Estimated: >60 mL/min (ref >60)
GFR, Estimated: >60 mL/min (ref >60)
Glucose, Bld: 476 mg/dL — ABNORMAL HIGH (ref 70–99)
Glucose, Bld: 243 mg/dL — ABNORMAL HIGH (ref 70–99)
Potassium: 3.8 mmol/L (ref 3.5–5.1)
Potassium: 3.5 mmol/L (ref 3.5–5.1)
Sodium: 127 mmol/L — ABNORMAL LOW (ref 135–145)
Sodium: 130 mmol/L — ABNORMAL LOW (ref 135–145)

## 2023-03-05 LAB — CBC
HCT: 42.3 % (ref 39.0–52.0)
Hemoglobin: 13.5 g/dL (ref 13.0–17.0)
MCH: 25.5 pg — ABNORMAL LOW (ref 26.0–34.0)
MCHC: 31.9 g/dL (ref 30.0–36.0)
MCV: 79.8 fL — ABNORMAL LOW (ref 80.0–100.0)
Platelets: 272 10*3/uL (ref 150–400)
RBC: 5.3 MIL/uL (ref 4.22–5.81)
RDW: 16.7 % — ABNORMAL HIGH (ref 11.5–15.5)
WBC: 8 10*3/uL (ref 4.0–10.5)
nRBC: 0 % (ref 0.0–0.2)

## 2023-03-05 LAB — BLOOD GAS, VENOUS
Acid-base deficit: 5.5 mmol/L — ABNORMAL HIGH (ref 0.0–2.0)
Bicarbonate: 20.7 mmol/L (ref 20.0–28.0)
Drawn by: 27160
O2 Saturation: 43 %
Patient temperature: 36.7
pCO2, Ven: 41 mmHg — ABNORMAL LOW (ref 44–60)
pH, Ven: 7.3 (ref 7.25–7.43)
pO2, Ven: <31 mmHg — CL (ref 32–45)

## 2023-03-05 LAB — URINALYSIS, ROUTINE W REFLEX MICROSCOPIC
Glucose, UA: >=500 mg/dL — AB
Ketones, ur: 40 mg/dL — AB
Leukocytes,Ua: NEGATIVE
Nitrite: NEGATIVE
Protein, ur: NEGATIVE mg/dL
Specific Gravity, Urine: 1.01 (ref 1.005–1.030)
pH: 6 (ref 5.0–8.0)

## 2023-03-05 LAB — URINALYSIS, MICROSCOPIC (REFLEX)
Bacteria, UA: NONE SEEN
Squamous Epithelial / HPF: NONE SEEN /HPF (ref 0–5)
WBC, UA: NONE SEEN WBC/hpf (ref 0–5)

## 2023-03-05 LAB — GLUCOSE, CAPILLARY
Glucose-Capillary: 229 mg/dL — ABNORMAL HIGH (ref 70–99)
Glucose-Capillary: 233 mg/dL — ABNORMAL HIGH (ref 70–99)
Glucose-Capillary: 189 mg/dL — ABNORMAL HIGH (ref 70–99)
Glucose-Capillary: 208 mg/dL — ABNORMAL HIGH (ref 70–99)
Glucose-Capillary: 208 mg/dL — ABNORMAL HIGH (ref 70–99)

## 2023-03-05 LAB — BETA-HYDROXYBUTYRIC ACID
Beta-Hydroxybutyric Acid: 6.71 mmol/L — ABNORMAL HIGH (ref 0.05–0.27)
Beta-Hydroxybutyric Acid: 4.75 mmol/L — ABNORMAL HIGH (ref 0.05–0.27)

## 2023-03-05 LAB — CBG MONITORING, ED
Glucose-Capillary: 484 mg/dL — ABNORMAL HIGH (ref 70–99)
Glucose-Capillary: 453 mg/dL — ABNORMAL HIGH (ref 70–99)

## 2023-03-05 MED ORDER — DEXTROSE IN LACTATED RINGERS 5 % IV SOLN: INTRAVENOUS | Status: DC

## 2023-03-05 MED ORDER — HYDROCHLOROTHIAZIDE 25 MG PO TABS
25.0000 mg | ORAL_TABLET | Freq: Every day | ORAL | Status: DC
  Administered 2023-03-06: 25 mg via ORAL
  Filled 2023-03-05: qty 1

## 2023-03-05 MED ORDER — INSULIN REGULAR(HUMAN) IN NACL 100-0.9 UT/100ML-% IV SOLN
INTRAVENOUS | Status: DC
  Administered 2023-03-05: 15 [IU]/h via INTRAVENOUS
  Filled 2023-03-05: qty 100

## 2023-03-05 MED ORDER — LOSARTAN POTASSIUM-HCTZ 100-25 MG PO TABS: 1.0000 | ORAL_TABLET | Freq: Every day | ORAL | Status: DC

## 2023-03-05 MED ORDER — LACTATED RINGERS IV SOLN: INTRAVENOUS | Status: DC

## 2023-03-05 MED ORDER — AMLODIPINE BESYLATE 5 MG PO TABS
10.0000 mg | ORAL_TABLET | Freq: Every day | ORAL | Status: DC
  Administered 2023-03-06: 10 mg via ORAL
  Filled 2023-03-05: qty 2

## 2023-03-05 MED ORDER — LOSARTAN POTASSIUM 50 MG PO TABS
100.0000 mg | ORAL_TABLET | Freq: Every day | ORAL | Status: DC
  Administered 2023-03-06: 100 mg via ORAL
  Filled 2023-03-05: qty 2

## 2023-03-05 MED ORDER — LACTATED RINGERS IV BOLUS
20.0000 mL/kg | Freq: Once | INTRAVENOUS | Status: AC
  Administered 2023-03-05: 3230 mL via INTRAVENOUS

## 2023-03-05 MED ORDER — DEXTROSE 50 % IV SOLN: 0.0000 mL | INTRAVENOUS | Status: DC | PRN

## 2023-03-05 MED ORDER — POTASSIUM CHLORIDE 10 MEQ/100ML IV SOLN
10.0000 meq | INTRAVENOUS | Status: AC
  Administered 2023-03-05: 10 meq via INTRAVENOUS
  Filled 2023-03-05: qty 100

## 2023-03-05 NOTE — H&P (Signed)
History and Physical    Patient: Timothy Dalton:[id number] DOB: 08/15/1966 DOA: 03/05/2023 DOS: the patient was seen and examined on 03/05/2023 PCP: Patient, No Pcp Per  Patient coming from: Home  Chief Complaint:  Chief Complaint  Patient presents with   Hyperglycemia   HPI: Timothy Dalton is a 57 y.o. male with medical history significant of hypertension, obesity with a BMI of 46.  Patient is scheduled for a routine orthopedic rotator cuff surgery and had routine labs done.  He was noticed on his labs that his blood sugar was in the 400s.  He was advised to come to the hospital for evaluation.  Here, the patient found to be hyperglycemic with a increased anion gap metabolic acidosis.  He did has been having polyuria, polydipsia, blurred vision.  This has been going on for several weeks.  Patient did have surgery on his knee at the beginning of the year and was relatively immobile.  With his change in mobility, the patient has also been increasing his intake of cheese cake, regular cake, and soda.  He estimates that he has put on about 15 pounds over the past several months.  Review of Systems: As mentioned in the history of present illness. All other systems reviewed and are negative. Past Medical History:  Diagnosis Date   Arthritis    Gallstone    HTN (hypertension)    Pancreatitis    Past Surgical History:  Procedure Laterality Date   CHOLECYSTECTOMY N/A 04/30/2016   Procedure: LAPAROSCOPIC CHOLECYSTECTOMY;  Surgeon: Ancil Linsey, MD;  Location: [facility];  Service: General;  Laterality: N/A;   KNEE ARTHROSCOPY Left 11/15/2022   Procedure: LEFT KNEE ARTHROSCOPY with partial medial and lateral menisectomy;  Surgeon: Gean Birchwood, MD;  Location: [facility];  Service: Orthopedics;  Laterality: Left;   Social History:  reports that he quit smoking about 10 months ago. His smoking use included cigarettes. He smoked an average of .25 packs per day. He has never used smokeless tobacco. He  reports that he does not currently use alcohol. He reports that he does not use drugs.  No Known Allergies  Family History  Problem Relation Age of Onset   Colon cancer Neg Hx    Pancreatic disease Neg Hx    Liver disease Neg Hx    Colon polyps Neg Hx     Prior to Admission medications   Medication Sig Start Date End Date Taking? Authorizing Provider  amLODipine (NORVASC) 10 MG tablet Take 1 tablet (10 mg total) by mouth daily. 10/15/22 03/05/23 Yes Gareth Eagle, PA-C  ibuprofen (ADVIL) 200 MG tablet Take 800 mg by mouth every 6 (six) hours as needed for moderate pain.   Yes [provider]  losartan-hydrochlorothiazide (HYZAAR) 100-25 MG tablet Take 1 tablet by mouth daily. 10/15/22 03/05/23 Yes Gareth Eagle, PA-C  traMADol (ULTRAM) 50 MG tablet Take by mouth. Patient not taking: Reported on 03/05/2023 12/21/22   [provider]    Physical Exam: Vitals:   03/05/23 1530 03/05/23 1600 03/05/23 1659 03/05/23 1838  BP: 113/72 111/73    Pulse: (!) 110 97    Resp: 18     Temp:    98.6 F (37 C)  TempSrc:    Oral  SpO2: 94% 90%    Weight:   (!) 161.5 kg   Height:   6\' 1"  (1.854 m)    General: Middle-age male. Awake and alert and oriented x3. No acute cardiopulmonary distress.  HEENT:  Normocephalic atraumatic.  Right and left ears normal in appearance.  Pupils equal, round, reactive to light. Extraocular muscles are intact. Sclerae anicteric and noninjected.  Moist mucosal membranes. No mucosal lesions.  Neck: Neck supple without lymphadenopathy. No carotid bruits. No masses palpated.  Cardiovascular: Regular rate with normal S1-S2 sounds. No murmurs, rubs, gallops auscultated. No JVD.  Respiratory: Good respiratory effort with no wheezes, rales, rhonchi. Lungs clear to auscultation bilaterally.  No accessory muscle use. Abdomen: Soft, nontender, nondistended. Active bowel sounds. No masses or hepatosplenomegaly  Skin: No rashes, lesions, or ulcerations.  Dry,  warm to touch. 2+ dorsalis pedis and radial pulses. Musculoskeletal: No calf or leg pain. All major joints not erythematous nontender.  No upper or lower joint deformation.  Good ROM.  No contractures  Psychiatric: Intact judgment and insight. Pleasant and cooperative. Neurologic: No focal neurological deficits. Strength is 5/5 and symmetric in upper and lower extremities.  Cranial nerves II through XII are grossly intact.  Data Reviewed: Results for orders placed or performed during the hospital encounter of 03/05/23 (from the past 24 hour(s))  CBG monitoring, ED     Status: Abnormal   Collection Time: 03/05/23  2:59 PM  Result Value Ref Range   Glucose-Capillary 484 (H) 70 - 99 mg/dL  Basic metabolic panel     Status: Abnormal   Collection Time: 03/05/23  3:19 PM  Result Value Ref Range   Sodium 127 (L) 135 - 145 mmol/L   Potassium 3.8 3.5 - 5.1 mmol/L   Chloride 89 (L) 98 - 111 mmol/L   CO2 17 (L) 22 - 32 mmol/L   Glucose, Bld 476 (H) 70 - 99 mg/dL   BUN 14 6 - 20 mg/dL   Creatinine, Ser 1.61 (H) 0.61 - 1.24 mg/dL   Calcium 8.8 (L) 8.9 - 10.3 mg/dL   GFR, Estimated >09 >60 mL/min   Anion gap 21 (H) 5 - 15  CBC     Status: Abnormal   Collection Time: 03/05/23  3:19 PM  Result Value Ref Range   WBC 8.0 4.0 - 10.5 K/uL   RBC 5.30 4.22 - 5.81 MIL/uL   Hemoglobin 13.5 13.0 - 17.0 g/dL   HCT 45.4 09.8 - 11.9 %   MCV 79.8 (L) 80.0 - 100.0 fL   MCH 25.5 (L) 26.0 - 34.0 pg   MCHC 31.9 30.0 - 36.0 g/dL   RDW 14.7 (H) 82.9 - 56.2 %   Platelets 272 150 - 400 K/uL   nRBC 0.0 0.0 - 0.2 %  Urinalysis, Routine w reflex microscopic -Urine, Clean Catch     Status: Abnormal   Collection Time: 03/05/23  4:38 PM  Result Value Ref Range   Color, Urine YELLOW YELLOW   APPearance CLEAR CLEAR   Specific Gravity, Urine 1.010 1.005 - 1.030   pH 6.0 5.0 - 8.0   Glucose, UA >=500 (A) NEGATIVE mg/dL   Hgb urine dipstick TRACE (A) NEGATIVE   Bilirubin Urine MODERATE (A) NEGATIVE   Ketones, ur 40  (A) NEGATIVE mg/dL   Protein, ur NEGATIVE NEGATIVE mg/dL   Nitrite NEGATIVE NEGATIVE   Leukocytes,Ua NEGATIVE NEGATIVE  Urinalysis, Microscopic (reflex)     Status: None   Collection Time: 03/05/23  4:38 PM  Result Value Ref Range   RBC / HPF 0-5 0 - 5 RBC/hpf   WBC, UA NONE SEEN 0 - 5 WBC/hpf   Bacteria, UA NONE SEEN NONE SEEN   Squamous Epithelial / HPF NONE SEEN 0 - 5 /  HPF  Beta-hydroxybutyric acid     Status: Abnormal   Collection Time: 03/05/23  4:39 PM  Result Value Ref Range   Beta-Hydroxybutyric Acid 6.71 (H) 0.05 - 0.27 mmol/L  Blood gas, venous     Status: Abnormal   Collection Time: 03/05/23  5:33 PM  Result Value Ref Range   pH, Ven 7.3 7.25 - 7.43   pCO2, Ven 41 (L) 44 - 60 mmHg   pO2, Ven <31 (LL) 32 - 45 mmHg   Bicarbonate 20.7 20.0 - 28.0 mmol/L   Acid-base deficit 5.5 (H) 0.0 - 2.0 mmol/L   O2 Saturation 43 %   Patient temperature 36.7    Collection site HAND    Drawn by [id number]   CBG monitoring, ED     Status: Abnormal   Collection Time: 03/05/23  5:55 PM  Result Value Ref Range   Glucose-Capillary 453 (H) 70 - 99 mg/dL  Glucose, capillary     Status: Abnormal   Collection Time: 03/05/23  7:35 PM  Result Value Ref Range   Glucose-Capillary 233 (H) 70 - 99 mg/dL    No results found.   Assessment and Plan: No notes have been filed under this hospital service. Service: Hospitalist  Principal Problem:   DKA, type 2 (HCC) Active Problems:   HTN (hypertension)  DKA secondary to new DM2 admit to stepdown Telemetry monitoring Continue aggressive IV hydration CBGs every hour Basic metabolic panel every 4 hours Potassium replacement: 10 mEq IV runs Noncaloric liquids Transition to D5 normal saline at 150 mL's per hour with CBG at 250 Transition to home insulin regimen when gap is closed and acidosis resolved. HTN Continue home regimen   Advance Care Planning:   Code Status: Prior full code  Consults: none  Family Communication: none  Severity  of Illness: The appropriate patient status for this patient is INPATIENT. Inpatient status is judged to be reasonable and necessary in order to provide the required intensity of service to ensure the patient's safety. The patient's presenting symptoms, physical exam findings, and initial radiographic and laboratory data in the context of their chronic comorbidities is felt to place them at high risk for further clinical deterioration. Furthermore, it is not anticipated that the patient will be medically stable for discharge from the hospital within 2 midnights of admission.   * I certify that at the point of admission it is my clinical judgment that the patient will require inpatient hospital care spanning beyond 2 midnights from the point of admission due to high intensity of service, high risk for further deterioration and high frequency of surveillance required.*  Author: Levie Heritage, DO 03/05/2023 7:55 PM  For on call review [email or web address].

## 2023-03-05 NOTE — ED Triage Notes (Signed)
Pt went for preop blood work for shoulder surgery yesterday and found that his blood sugar was 490mg /dl. Pt has been having increased thirst and urination. Pt has been dizzy and tired all the time.

## 2023-03-05 NOTE — ED Notes (Signed)
Pt reports increased thirst and urination for the past few weeks with intermittent vomiting. No history of diabetes. Pt states he does have high blood pressure but does not have PCP.

## 2023-03-05 NOTE — ED Notes (Signed)
Pt unable to give urine specimen at this time. Given urinal.

## 2023-03-05 NOTE — ED Provider Notes (Signed)
[place] EMERGENCY DEPARTMENT AT [facility] Provider Note   CSN: [id number] Arrival date & time: 03/05/23  1449     History  Chief Complaint  Patient presents with   Hyperglycemia    Timothy Dalton is a 57 y.o. male.   Hyperglycemia  This patient is a 57 year old male, history of hypertension on amlodipine and losartan with hydrochlorothiazide, has never had a history of diabetes though he has had a history of pancreatitis.  He used to be a drinker and a smoker but stopped about a year and a half ago.  He had been planning for rotator cuff surgery as an outpatient and went for preop lab work yesterday, was called and told that his blood sugar was 490.  Incidentally the patient is now telling me that for the last couple of weeks he has had increased urinary frequency especially at night, increased thirst and a couple of pound weight loss.  Mild nausea but no vomiting no diarrhea no abdominal pain no chest pain or shortness of breath, no headache, no weakness.    Home Medications Prior to Admission medications   Medication Sig Start Date End Date Taking? Authorizing Provider  amLODipine (NORVASC) 10 MG tablet Take 1 tablet (10 mg total) by mouth daily. 10/15/22 11/15/22  Gareth Eagle, PA-C  HYDROcodone-acetaminophen (NORCO/VICODIN) 5-325 MG tablet Take 1 tablet by mouth every 4 (four) hours as needed for moderate pain. 11/15/22 11/15/23  Allena Katz, PA-C  lisinopril (ZESTRIL) 20 MG tablet Take 1 tablet (20 mg total) by mouth daily. Patient not taking: Reported on 11/02/2022 01/02/22   Bethann Berkshire, MD  loratadine (CLARITIN) 10 MG tablet Take 1 a day for drainage in your sinuses Patient not taking: Reported on 11/02/2022 01/02/22   Bethann Berkshire, MD  losartan-hydrochlorothiazide (HYZAAR) 100-25 MG tablet Take 1 tablet by mouth daily. 10/15/22 11/15/22  Gareth Eagle, PA-C  methocarbamol (ROBAXIN) 500 MG tablet Take 1 tablet (500 mg total) by mouth 2 (two) times  daily. Patient not taking: Reported on 11/02/2022 06/24/22   Marita Kansas, PA-C  naproxen (NAPROSYN) 375 MG tablet Take 1 tablet (375 mg total) by mouth 2 (two) times daily. Patient not taking: Reported on 11/02/2022 06/24/22   Marita Kansas, PA-C  predniSONE (DELTASONE) 10 MG tablet Take 2 tablets (20 mg total) by mouth daily. Patient not taking: Reported on 11/02/2022 01/02/22   Bethann Berkshire, MD      Allergies    Patient has no known allergies.    Review of Systems   Review of Systems  All other systems reviewed and are negative.   Physical Exam Updated Vital Signs BP 113/72   Pulse (!) 110   Resp 18   Ht 1.854 m (6\' 1" )   Wt (!) 161.5 kg   SpO2 94%   BMI 46.97 kg/m  Physical Exam Vitals and nursing note reviewed.  Constitutional:      General: He is not in acute distress.    Appearance: He is well-developed.  HENT:     Head: Normocephalic and atraumatic.     Mouth/Throat:     Mouth: Mucous membranes are dry.     Pharynx: No oropharyngeal exudate.  Eyes:     General: No scleral icterus.       Right eye: No discharge.        Left eye: No discharge.     Conjunctiva/sclera: Conjunctivae normal.     Pupils: Pupils are equal, round, and reactive to light.  Neck:     Thyroid: No thyromegaly.     Vascular: No JVD.  Cardiovascular:     Rate and Rhythm: Regular rhythm. Tachycardia present.     Heart sounds: Normal heart sounds. No murmur heard.    No friction rub. No gallop.  Pulmonary:     Effort: Pulmonary effort is normal. No respiratory distress.     Breath sounds: Normal breath sounds. No wheezing or rales.  Abdominal:     General: Bowel sounds are normal. There is no distension.     Palpations: Abdomen is soft. There is no mass.     Tenderness: There is no abdominal tenderness.  Musculoskeletal:        General: No tenderness. Normal range of motion.     Cervical back: Normal range of motion and neck supple.     Right lower leg: No edema.     Left lower leg: No edema.   Lymphadenopathy:     Cervical: No cervical adenopathy.  Skin:    General: Skin is warm and dry.     Findings: No erythema or rash.  Neurological:     Mental Status: He is alert.     Coordination: Coordination normal.  Psychiatric:        Behavior: Behavior normal.     ED Results / Procedures / Treatments   Labs (all labs ordered are listed, but only abnormal results are displayed) Labs Reviewed  BASIC METABOLIC PANEL - Abnormal; Notable for the following components:      Result Value   Sodium 127 (*)    Chloride 89 (*)    CO2 17 (*)    Glucose, Bld 476 (*)    Creatinine, Ser 1.35 (*)    Calcium 8.8 (*)    Anion gap 21 (*)    All other components within normal limits  CBC - Abnormal; Notable for the following components:   MCV 79.8 (*)    MCH 25.5 (*)    RDW 16.7 (*)    All other components within normal limits  URINALYSIS, ROUTINE W REFLEX MICROSCOPIC - Abnormal; Notable for the following components:   Glucose, UA >=500 (*)    Hgb urine dipstick TRACE (*)    Bilirubin Urine MODERATE (*)    Ketones, ur 40 (*)    All other components within normal limits  BETA-HYDROXYBUTYRIC ACID - Abnormal; Notable for the following components:   Beta-Hydroxybutyric Acid 6.71 (*)    All other components within normal limits  CBG MONITORING, ED - Abnormal; Notable for the following components:   Glucose-Capillary 484 (*)    All other components within normal limits  URINALYSIS, MICROSCOPIC (REFLEX)  BETA-HYDROXYBUTYRIC ACID  BLOOD GAS, VENOUS  CBG MONITORING, ED    EKG None  Radiology No results found.  Procedures .Critical Care  Performed by: Eber Hong, MD Authorized by: Eber Hong, MD   Critical care provider statement:    Critical care time (minutes):  45   Critical care time was exclusive of:  Separately billable procedures and treating other patients and teaching time   Critical care was necessary to treat or prevent imminent or life-threatening  deterioration of the following conditions:  Endocrine crisis   Critical care was time spent personally by me on the following activities:  Development of treatment plan with patient or surrogate, discussions with consultants, evaluation of patient's response to treatment, examination of patient, obtaining history from patient or surrogate, review of old charts, re-evaluation of patient's condition, pulse oximetry, ordering  and review of radiographic studies, ordering and review of laboratory studies and ordering and performing treatments and interventions   I assumed direction of critical care for this patient from another provider in my specialty: no     Care discussed with: admitting provider   Comments:           Medications Ordered in ED Medications  insulin regular, human (MYXREDLIN) 100 units/ 100 mL infusion (15 Units/hr Intravenous New Bag/Given 03/05/23 1738)  lactated ringers infusion (has no administration in time range)  dextrose 5 % in lactated ringers infusion (has no administration in time range)  dextrose 50 % solution 0-50 mL (has no administration in time range)  potassium chloride 10 mEq in 100 mL IVPB (10 mEq Intravenous New Bag/Given 03/05/23 1739)  lactated ringers bolus 3,230 mL (3,230 mLs Intravenous New Bag/Given 03/05/23 1730)    ED Course/ Medical Decision Making/ A&P                             Medical Decision Making Amount and/or Complexity of Data Reviewed Labs: ordered.  Risk Prescription drug management. Decision regarding hospitalization.    This patient presents to the ED for concern of new onset diabetes most likely, this involves an extensive number of treatment options, and is a complaint that carries with it a high risk of complications and morbidity.  The differential diagnosis includes new onset diabetes though the patient did have a banana prior to getting his lab work done I would not expect it to be that high and the rest of his symptoms are  consistent with hyperglycemia including polyuria and polydipsia.  Does not seem to be consistent with infection or stroke or heart attack he has no other symptoms.   Co morbidities that complicate the patient evaluation  Obesity, hypertension, prior smoker and drinker   Additional history obtained:  Additional history obtained from medical record External records from outside source obtained and reviewed including has been admitted to the hospital for arthroscopy in the past, he has had office visits for his orthopedic injuries at [facility], no other recent admissions to the hospital.   Lab Tests:  I Ordered, and personally interpreted labs.  The pertinent results include: CBC metabolic panel urinalysis, this all reveals that there is what appears to be a metabolic acidosis, beta hydroxybutyric acid and VBG ordered   Cardiac Monitoring: / EKG:  The patient was maintained on a cardiac monitor.  I personally viewed and interpreted the cardiac monitored which showed an underlying rhythm of: Normal sinus rhythm in fact a sinus tachycardia at 110   Consultations Obtained:  I requested consultation with the hospitalist,  and discussed lab and imaging findings as well as pertinent plan - they recommend: Admission to stepdown on insulin drip   Problem List / ED Course / Critical interventions / Medication management  The patient has labs and a workup that is consistent with having new onset diabetes ketoacidosis.  This is very concerning especially in the presence of needing surgery soon and will need to start being treated.  I discussed the care with Dr. Adrian Blackwater was been kind enough to admit this patient, insulin drip started, potassium supplemented as it is on the low side.  Pseudohyponatremia present. I ordered medication including insulin for diabetic ketoacidosis Reevaluation of the patient after these medicines showed that the patient unchanged I have reviewed the patients home medicines  and have made adjustments as needed  Social Determinants of Health:  Obesity   Test / Admission - Considered:  Will admit to higher level of care         Final Clinical Impression(s) / ED Diagnoses Final diagnoses:  Diabetic ketoacidosis without coma associated with type 1 diabetes mellitus [facility])    Rx / DC Orders ED Discharge Orders     None         Eber Hong, MD 03/05/23 [phone]

## 2023-03-05 NOTE — Progress Notes (Signed)
Date and time results received: 03/05/23 1830 (use smartphrase ".now" to insert current time)  Test: VBG Critical Value: < 31  Name of Provider Notified: Dr. Adrian Blackwater and assigned nurse Milus Mallick RN   Orders Received? Or Actions Taken?: Awaiting a response.

## 2023-03-06 DIAGNOSIS — G4733 Obstructive sleep apnea (adult) (pediatric): Secondary | ICD-10-CM | POA: Diagnosis present

## 2023-03-06 DIAGNOSIS — M25519 Pain in unspecified shoulder: Secondary | ICD-10-CM | POA: Diagnosis present

## 2023-03-06 DIAGNOSIS — E119 Type 2 diabetes mellitus without complications: Secondary | ICD-10-CM

## 2023-03-06 LAB — GLUCOSE, CAPILLARY
Glucose-Capillary: 182 mg/dL — ABNORMAL HIGH (ref 70–99)
Glucose-Capillary: 187 mg/dL — ABNORMAL HIGH (ref 70–99)
Glucose-Capillary: 194 mg/dL — ABNORMAL HIGH (ref 70–99)
Glucose-Capillary: 197 mg/dL — ABNORMAL HIGH (ref 70–99)
Glucose-Capillary: 200 mg/dL — ABNORMAL HIGH (ref 70–99)
Glucose-Capillary: 201 mg/dL — ABNORMAL HIGH (ref 70–99)
Glucose-Capillary: 201 mg/dL — ABNORMAL HIGH (ref 70–99)
Glucose-Capillary: 203 mg/dL — ABNORMAL HIGH (ref 70–99)
Glucose-Capillary: 223 mg/dL — ABNORMAL HIGH (ref 70–99)
Glucose-Capillary: 227 mg/dL — ABNORMAL HIGH (ref 70–99)
Glucose-Capillary: 237 mg/dL — ABNORMAL HIGH (ref 70–99)
Glucose-Capillary: 251 mg/dL — ABNORMAL HIGH (ref 70–99)

## 2023-03-06 LAB — BASIC METABOLIC PANEL
Anion gap: 10 (ref 5–15)
Anion gap: 13 (ref 5–15)
Anion gap: 13 (ref 5–15)
Anion gap: 15 (ref 5–15)
BUN: 10 mg/dL (ref 6–20)
BUN: 11 mg/dL (ref 6–20)
BUN: 8 mg/dL (ref 6–20)
BUN: 9 mg/dL (ref 6–20)
CO2: 21 mmol/L — ABNORMAL LOW (ref 22–32)
CO2: 22 mmol/L (ref 22–32)
CO2: 23 mmol/L (ref 22–32)
CO2: 25 mmol/L (ref 22–32)
Calcium: 8.5 mg/dL — ABNORMAL LOW (ref 8.9–10.3)
Calcium: 8.6 mg/dL — ABNORMAL LOW (ref 8.9–10.3)
Calcium: 8.7 mg/dL — ABNORMAL LOW (ref 8.9–10.3)
Calcium: 8.9 mg/dL (ref 8.9–10.3)
Chloride: 95 mmol/L — ABNORMAL LOW (ref 98–111)
Chloride: 95 mmol/L — ABNORMAL LOW (ref 98–111)
Chloride: 96 mmol/L — ABNORMAL LOW (ref 98–111)
Chloride: 97 mmol/L — ABNORMAL LOW (ref 98–111)
Creatinine, Ser: 0.84 mg/dL (ref 0.61–1.24)
Creatinine, Ser: 0.84 mg/dL (ref 0.61–1.24)
Creatinine, Ser: 0.91 mg/dL (ref 0.61–1.24)
Creatinine, Ser: 0.98 mg/dL (ref 0.61–1.24)
GFR, Estimated: >60 mL/min (ref >60)
GFR, Estimated: >60 mL/min (ref >60)
GFR, Estimated: >60 mL/min (ref >60)
GFR, Estimated: >60 mL/min (ref >60)
Glucose, Bld: 190 mg/dL — ABNORMAL HIGH (ref 70–99)
Glucose, Bld: 200 mg/dL — ABNORMAL HIGH (ref 70–99)
Glucose, Bld: 204 mg/dL — ABNORMAL HIGH (ref 70–99)
Glucose, Bld: 212 mg/dL — ABNORMAL HIGH (ref 70–99)
Potassium: 2.8 mmol/L — ABNORMAL LOW (ref 3.5–5.1)
Potassium: 2.8 mmol/L — ABNORMAL LOW (ref 3.5–5.1)
Potassium: 3 mmol/L — ABNORMAL LOW (ref 3.5–5.1)
Potassium: 3.2 mmol/L — ABNORMAL LOW (ref 3.5–5.1)
Sodium: 131 mmol/L — ABNORMAL LOW (ref 135–145)
Sodium: 131 mmol/L — ABNORMAL LOW (ref 135–145)
Sodium: 131 mmol/L — ABNORMAL LOW (ref 135–145)
Sodium: 132 mmol/L — ABNORMAL LOW (ref 135–145)

## 2023-03-06 LAB — HEMOGLOBIN A1C
Hgb A1c MFr Bld: 12.3 % — ABNORMAL HIGH (ref 4.8–5.6)
Mean Plasma Glucose: 306.31 mg/dL

## 2023-03-06 LAB — CBC
HCT: 38.2 % — ABNORMAL LOW (ref 39.0–52.0)
Hemoglobin: 12.5 g/dL — ABNORMAL LOW (ref 13.0–17.0)
MCH: 25.6 pg — ABNORMAL LOW (ref 26.0–34.0)
MCHC: 32.7 g/dL (ref 30.0–36.0)
MCV: 78.3 fL — ABNORMAL LOW (ref 80.0–100.0)
Platelets: 227 10*3/uL (ref 150–400)
RBC: 4.88 MIL/uL (ref 4.22–5.81)
RDW: 16.6 % — ABNORMAL HIGH (ref 11.5–15.5)
WBC: 7.3 10*3/uL (ref 4.0–10.5)
nRBC: 0 % (ref 0.0–0.2)

## 2023-03-06 LAB — BETA-HYDROXYBUTYRIC ACID
Beta-Hydroxybutyric Acid: 2.51 mmol/L — ABNORMAL HIGH (ref 0.05–0.27)
Beta-Hydroxybutyric Acid: 1.31 mmol/L — ABNORMAL HIGH (ref 0.05–0.27)

## 2023-03-06 LAB — MRSA NEXT GEN BY PCR, NASAL: MRSA by PCR Next Gen: NOT DETECTED

## 2023-03-06 MED ORDER — LOSARTAN POTASSIUM 100 MG PO TABS: 100.0000 mg | ORAL_TABLET | Freq: Every day | ORAL | 1 refills | Status: AC

## 2023-03-06 MED ORDER — INSULIN ASPART 100 UNIT/ML IJ SOLN
0.0000 [IU] | Freq: Three times a day (TID) | INTRAMUSCULAR | Status: DC
  Administered 2023-03-06: 5 [IU] via SUBCUTANEOUS

## 2023-03-06 MED ORDER — NOREPINEPHRINE 4 MG/250ML-% IV SOLN
INTRAVENOUS | Status: AC
  Filled 2023-03-06: qty 250

## 2023-03-06 MED ORDER — DAPAGLIFLOZIN PROPANEDIOL 5 MG PO TABS: 5.0000 mg | ORAL_TABLET | Freq: Every day | ORAL | 1 refills | Status: DC

## 2023-03-06 MED ORDER — METFORMIN HCL 850 MG PO TABS: 850.0000 mg | ORAL_TABLET | Freq: Two times a day (BID) | ORAL | 1 refills | Status: DC

## 2023-03-06 MED ORDER — POTASSIUM CHLORIDE IN NACL 40-0.9 MEQ/L-% IV SOLN: INTRAVENOUS | Status: DC

## 2023-03-06 MED ORDER — POTASSIUM CHLORIDE IN NACL 40-0.9 MEQ/L-% IV SOLN: INTRAVENOUS | Status: AC

## 2023-03-06 MED ORDER — SODIUM CHLORIDE 0.9 % IV SOLN: INTRAVENOUS | Status: DC

## 2023-03-06 MED ORDER — POTASSIUM CHLORIDE 10 MEQ/100ML IV SOLN
10.0000 meq | INTRAVENOUS | Status: AC
  Administered 2023-03-06 (×2): 10 meq via INTRAVENOUS
  Filled 2023-03-06 (×2): qty 100

## 2023-03-06 MED ORDER — POTASSIUM CHLORIDE CRYS ER 20 MEQ PO TBCR
40.0000 meq | EXTENDED_RELEASE_TABLET | Freq: Once | ORAL | Status: AC
  Administered 2023-03-06: 40 meq via ORAL
  Filled 2023-03-06: qty 2

## 2023-03-06 MED ORDER — CHLORHEXIDINE GLUCONATE CLOTH 2 % EX PADS
6.0000 | MEDICATED_PAD | Freq: Every day | CUTANEOUS | Status: DC
  Administered 2023-03-06: 6 via TOPICAL

## 2023-03-06 MED ORDER — ENOXAPARIN SODIUM 40 MG/0.4ML IJ SOSY
40.0000 mg | PREFILLED_SYRINGE | INTRAMUSCULAR | Status: DC
  Administered 2023-03-06: 40 mg via SUBCUTANEOUS
  Filled 2023-03-06: qty 0.4

## 2023-03-06 MED ORDER — ENOXAPARIN SODIUM 80 MG/0.8ML IJ SOSY: 80.0000 mg | PREFILLED_SYRINGE | INTRAMUSCULAR | Status: DC

## 2023-03-06 MED ORDER — BLOOD GLUCOSE METER KIT: PACK | 0 refills | Status: AC

## 2023-03-06 MED ORDER — INSULIN ASPART 100 UNIT/ML IJ SOLN: 0.0000 [IU] | Freq: Every day | INTRAMUSCULAR | Status: DC

## 2023-03-06 MED ORDER — LIVING WELL WITH DIABETES BOOK: Freq: Once | Status: DC

## 2023-03-06 MED ORDER — INSULIN GLARGINE-YFGN 100 UNIT/ML ~~LOC~~ SOLN
10.0000 [IU] | SUBCUTANEOUS | Status: DC
  Administered 2023-03-06: 10 [IU] via SUBCUTANEOUS
  Filled 2023-03-06 (×3): qty 0.1

## 2023-03-06 NOTE — Assessment & Plan Note (Signed)
-  Scheduled for rotator cuff repair on 5/30 -Assuming he does well with taking medications and is able to follow up with PCP or surgeon for labs sooner, hopefully this won't need to be delayed

## 2023-03-06 NOTE — Inpatient Diabetes Management (Addendum)
Inpatient Diabetes Program Recommendations  AACE/ADA: New Consensus Statement on Inpatient Glycemic Control (2015)  Target Ranges:  Prepandial:   less than 140 mg/dL      Peak postprandial:   less than 180 mg/dL (1-2 hours)      Critically ill patients:  140 - 180 mg/dL   Lab Results  Component Value Date   GLUCAP 197 (H) 03/06/2023    Review of Glycemic Control  Diabetes history: new onset Outpatient Diabetes medications: none Current orders for Inpatient glycemic control: IV insulin drip  Inpatient Diabetes Program Recommendations:   Noted that patient was admitted to ED with CBG of 476 mg/dl. Having classic symptoms of elevated blood sugars at home.  When patient is ready to transition off IV insulin drip, Recommend Semglee 25 units daily (weight based: 161.5 kg X 0.15 units/kg=24.22 units) Novolog 0-15 units correction scale every 4 hours while NPO, then TID Novolog 0-5 units HS scale Novolog 4 units TID with meals if eating at least 50% of meal. Titrate dosages as needed.  Will continue to monitor blood sugars while in the hospital.  Smith Mince RN BSN CDE Diabetes Coordinator Pager: [phone]  8am-5pm

## 2023-03-06 NOTE — Progress Notes (Signed)
AM K+2.8, asymptomatic, NSR. Thomes Dinning, MD notified. Order to stop insulin gtt until x2 K+ runs have infused. 40 mEq K+ PO ordered as well.

## 2023-03-06 NOTE — TOC Transition Note (Signed)
Transition of Care [facility]) - CM/SW Discharge Note   Patient Details  Name: Timothy Dalton MRN: [id number] Date of Birth: Jan 11, 1966  Transition of Care (TOC) CM/SW Contact:  Catalina Gravel, LCSW Phone Number: 03/06/2023, 1:06 PM   Clinical Narrative:    CSW met with pt at bedside.  Provided Care Connect information advising to call tomorrow for app and ask for a PCP. CSW also provided pt with PCP's in area accepting new pt should he have ins in future- or decide to pursue Medicaid. Further advised pt that  CSW called [facility] - agreed to provide good RX cards and info to him today.  Addendum:  Maci at pharmacy called back- provided cards and will return to provide pt info on free  or low cost supplies from the county  health depart.  Pt DC today.     Barriers to Discharge: No Barriers Identified   Patient Goals and CMS Choice      Discharge Placement                         Discharge Plan and Services Additional resources added to the After Visit Summary for                                       Social Determinants of Health (SDOH) Interventions SDOH Screenings   Food Insecurity: No Food Insecurity (03/05/2023)  Housing: Low Risk  (03/05/2023)  Transportation Needs: No Transportation Needs (03/05/2023)  Utilities: Not At Risk (03/05/2023)  Tobacco Use: Medium Risk (03/05/2023)     Readmission Risk Interventions     No data to display

## 2023-03-06 NOTE — Progress Notes (Signed)
Discharge instructions and script given to patient. Patient verbalizes understanding. Will take out via WC.

## 2023-03-06 NOTE — Inpatient Diabetes Management (Signed)
Spoke to patient on the phone. Diabetes coordinator is working remotely for weekend call.   Patient states that he has been having frequent urination and thirst for about 3 weeks. States that he had an accident at work and has will be having rotator cuff surgery due to the accident. He does have workers comp. States that his grandmother had diabetes. He has had pancreatitis in the past. Drinks regular soda and eats some sweets.   States that he does not want to take insulin. Recommend starting on oral agents. Does not have health insurance. Needs PCP after discharge. Ordered Living Well with Diabetes booklet. Will need prescription for home blood glucose meter kit (order # [id number]) [employer] meter and strips are cheapest "out of pocket".   Will continue to monitor blood sugars while in the hospital.  Smith Mince RN BSN CDE Diabetes Coordinator Pager: [phone]  8am-5pm

## 2023-03-06 NOTE — Hospital Course (Signed)
57yo male with h/o HTN, morbid obesity presenting with hyperglycemia (noted on routine labs).  He was found to be in DKA and started on Endotool.  His DKA has resolved overnight.

## 2023-03-06 NOTE — Assessment & Plan Note (Addendum)
-  Patient without known h/o DM who presented with significant hyperglycemia -Marginal CO2 on presentation, no frank acidosis (pH 7.3), increased anion gap -He was started on Endotool for DKA and labs normalized quickly -Suspect new onset DM2 rather than true DKA -Regardless, he is improved and should be appropriate for dc today -No current complaints -Significant diabetes education provided at the time of the encounter including discussion about all of the sequelae of chronic diabetes and the importance of good glycemic control -Will give metformin 850 mg BID for now and he will need to establish with a PCP for short-term f/u -Formal diabetes education with DM coordinator, if possible -Nutrition consult for diet education -CM consult to assist with ensuring adequate outpatient f/u as well as diabetes supplies

## 2023-03-06 NOTE — Assessment & Plan Note (Signed)
-  Body mass index is 46.97 kg/m..  -Weight loss should be encouraged -Outpatient PCP/bariatric medicine/bariatric surgery f/u encouraged

## 2023-03-06 NOTE — Assessment & Plan Note (Signed)
-  RN noted "severe sleep apnea. Sats drop to 40-50% with perfect waveform." -Will need outpatient sleep study

## 2023-03-06 NOTE — Discharge Summary (Signed)
Physician Discharge Summary   Patient: Timothy Dalton MRN: [id number] DOB: 1966/05/26  Admit date:     03/05/2023  Discharge date: 03/06/23  Discharge Physician: Jonah Blue   PCP: Patient, No Pcp Per   Recommendations at discharge:   You have diabetes.  Find a primary care provider (PCP) as soon as possible and arrange an appointment within the next 1-2 weeks if possible Continue amlodipine (Norvasc) and losartan Stop taking Losartan/HCTZ for now Add metformin twice daily Add Farxiga once daily Check blood sugars and blood pressure 1-2 times daily and record results; take record to PCP when you go Schedule an eye appointment Wear shoes at all times and closely monitor feet for sores, blisters  Discharge Diagnoses: Principal Problem:   New onset type 2 diabetes mellitus (HCC) Active Problems:   HTN (hypertension)   OSA (obstructive sleep apnea)   Obesity, Class III, BMI 40-49.9 (morbid obesity) (HCC)   Shoulder pain   Hospital Course: 57yo male with h/o HTN, morbid obesity presenting with hyperglycemia (noted on routine labs).  He was found to be in DKA and started on Endotool.  His DKA has resolved overnight.  Assessment and Plan: * New onset type 2 diabetes mellitus (HCC) -Patient without known h/o DM who presented with significant hyperglycemia -Marginal CO2 on presentation, no frank acidosis (pH 7.3), increased anion gap -He was started on Endotool for DKA and labs normalized quickly -Suspect new onset DM2 rather than true DKA -Regardless, he is improved and should be appropriate for dc today -No current complaints -Significant diabetes education provided at the time of the encounter including discussion about all of the sequelae of chronic diabetes and the importance of good glycemic control -Will give metformin 850 mg BID for now and he will need to establish with a PCP for short-term f/u -Formal diabetes education with DM coordinator, if possible -Nutrition consult  for diet education -CM consult to assist with ensuring adequate outpatient f/u as well as diabetes supplies   Shoulder pain -Scheduled for rotator cuff repair on 5/30 -Assuming he does well with taking medications and is able to follow up with PCP or surgeon for labs sooner, hopefully this won't need to be delayed  Obesity, Class III, BMI 40-49.9 (morbid obesity) (HCC) -Body mass index is 46.97 kg/m..  -Weight loss should be encouraged -Outpatient PCP/bariatric medicine/bariatric surgery f/u encouraged   OSA (obstructive sleep apnea) -RN noted "severe sleep apnea. Sats drop to 40-50% with perfect waveform." -Will need outpatient sleep study  HTN (hypertension) -Continue losartan and amlodipine -Will add Farxiga if able to afford -Hold HCTZ for now     Consultants: Diabetes coordinator; TOC team Procedures performed: None  Disposition: Home Diet recommendation:  Carb modified diet DISCHARGE MEDICATION: Allergies as of 03/06/2023   No Known Allergies      Medication List     STOP taking these medications    ibuprofen 200 MG tablet Commonly known as: ADVIL   losartan-hydrochlorothiazide 100-25 MG tablet Commonly known as: Hyzaar   traMADol 50 MG tablet Commonly known as: ULTRAM       TAKE these medications    amLODipine 10 MG tablet Commonly known as: NORVASC Take 1 tablet (10 mg total) by mouth daily.   blood glucose meter kit and supplies Dispense based on patient and insurance preference. Use up to four times daily as directed. (FOR ICD-10 E10.9, E11.9).   dapagliflozin propanediol 5 MG Tabs tablet Commonly known as: Farxiga Take 1 tablet (5 mg total) by mouth  daily before breakfast.   losartan 100 MG tablet Commonly known as: COZAAR Take 1 tablet (100 mg total) by mouth daily. Start taking on: Mar 07, 2023   metFORMIN 850 MG tablet Commonly known as: GLUCOPHAGE Take 1 tablet (850 mg total) by mouth 2 (two) times daily with a meal.         Discharge Exam: Filed Weights   03/05/23 1659  Weight: (!) 161.5 kg   Subjective: Patient feels generally well, appears to understand diagnosis of DM.   Physical Exam:       Vitals:    03/06/23 0300 03/06/23 0400 03/06/23 0500 03/06/23 0600  BP: 101/68 126/66 (!) 159/64 (!) 141/57  Pulse: 86 96 85 96  Resp: 19 12 17 16   Temp:   97.6 F (36.4 C)      TempSrc:   Oral      SpO2: 100% 99% 96% 94%  Weight:          Height:            General:  Appears calm and comfortable and is in NAD Eyes:  EOMI, normal lids, iris ENT:  grossly normal hearing, lips & tongue, mmm; poor/absent dentition Neck:  no LAD, masses or thyromegaly Cardiovascular:  RRR, no m/r/g. No LE edema.  Respiratory:   CTA bilaterally with no wheezes/rales/rhonchi.  Normal respiratory effort. Abdomen:  soft, NT, ND Skin:  no rash or induration seen on limited exam Musculoskeletal:  grossly normal tone BUE/BLE, good ROM, no bony abnormality Lower extremity:  No LE edema.  Limited foot exam with no ulcerations.  2+ distal pulses. Psychiatric:  grossly normal mood and affect, speech fluent and appropriate, AOx3 Neurologic:  CN 2-12 grossly intact, moves all extremities in coordinated fashion     EKG: none     Labs on Admission: I have personally reviewed the available labs and imaging studies at the time of the admission.   Pertinent labs:     Na++ 131 CO2 normalized, was 17 on presentation K+ 3.0, 2.8 Glucose 190 Unremarkable CBC VBG on presentation: 7.3/41/20.7  Condition at discharge: stable  The results of significant diagnostics from this hospitalization (including imaging, microbiology, ancillary and laboratory) are listed below for reference.   Imaging Studies: No results found.  Microbiology: Results for orders placed or performed during the hospital encounter of 03/05/23  MRSA Next Gen by PCR, Nasal     Status: None   Collection Time: 03/06/23  4:11 AM   Specimen: Nasal Mucosa; Nasal  Swab  Result Value Ref Range Status   MRSA by PCR Next Gen NOT DETECTED NOT DETECTED Final    Comment: (NOTE) The GeneXpert MRSA Assay (FDA approved for NASAL specimens only), is one component of a comprehensive MRSA colonization surveillance program. It is not intended to diagnose MRSA infection nor to guide or monitor treatment for MRSA infections. Test performance is not FDA approved in patients less than 37 years old. Performed at [facility], [address] [place], [place] [address]     Discharge time spent: greater than 30 minutes.  Signed: Jonah Blue, MD [facility] 03/06/2023

## 2023-03-06 NOTE — Assessment & Plan Note (Signed)
-  Continue losartan and amlodipine -Will add Marcelline Deist if able to afford -Hold HCTZ for now

## 2023-03-07 LAB — GLUCOSE, CAPILLARY: Glucose-Capillary: 342 mg/dL — ABNORMAL HIGH (ref 70–99)

## 2023-03-17 ENCOUNTER — Telehealth: Payer: Self-pay

## 2023-03-17 NOTE — Telephone Encounter (Signed)
Received dual enrollment packet today from [facility]. Attempted to contact to introduce Care Connect program and evaluate needs as a newly diagnosed Diabetic. Planned also to discuss applying for Medicaid.  No answer left VM. Will mail Welcome to Care Connect letter as well as Diabetic education materials printed from [facility].   Most recent A1C 12.3 on 03/06/23 in hospital. Last [facility] appointment was on 03/07/23 Next [facility] appointment is scheduled for 03/21/23   Plan: will continue to plan to attempt to reach client by phone.   Francee Nodal RN [employer]

## 2023-03-25 ENCOUNTER — Telehealth: Payer: Self-pay

## 2023-03-25 NOTE — Telephone Encounter (Signed)
Attempted call to Care Connect client for follow up. New DM and is now established with [facility] Last appt 03/21/23 next appt 06/21/23  Most recent A1C 12.1 on 03/07/23   No answer, left message, will plan to continue attempt to reach and discuss Care Connect services and assess for any client needs we may assist to provide.  Francee Nodal RN [employer]

## 2023-03-29 ENCOUNTER — Telehealth: Payer: Self-pay

## 2023-03-29 NOTE — Telephone Encounter (Signed)
Attempted call for follow up from [facility]. No answer, left voicemail requesting return call. Welcome letter was mailed on 03/17/23 telling the [facility] services along with phone numbers.    Will continue to attempt to reach. No appointments showing for [facility], client is Diabetic uncontrolled by last reviewed A1C.   Francee Nodal RN [employer]

## 2023-04-20 ENCOUNTER — Telehealth: Payer: Self-pay

## 2023-04-20 NOTE — Telephone Encounter (Signed)
Received referral from [facility] Dept - LVM for pt to confirm if he has been added to [employer] or approved for [employer].

## 2023-04-27 DIAGNOSIS — I10 Essential (primary) hypertension: Secondary | ICD-10-CM | POA: Diagnosis not present

## 2023-04-27 DIAGNOSIS — E119 Type 2 diabetes mellitus without complications: Secondary | ICD-10-CM | POA: Diagnosis not present

## 2023-04-27 DIAGNOSIS — G4733 Obstructive sleep apnea (adult) (pediatric): Secondary | ICD-10-CM | POA: Diagnosis not present

## 2023-04-27 DIAGNOSIS — Z0189 Encounter for other specified special examinations: Secondary | ICD-10-CM | POA: Diagnosis not present

## 2023-04-27 DIAGNOSIS — Z6841 Body Mass Index (BMI) 40.0 and over, adult: Secondary | ICD-10-CM | POA: Diagnosis not present

## 2023-05-19 DIAGNOSIS — Z6841 Body Mass Index (BMI) 40.0 and over, adult: Secondary | ICD-10-CM | POA: Diagnosis not present

## 2023-05-19 DIAGNOSIS — I10 Essential (primary) hypertension: Secondary | ICD-10-CM | POA: Diagnosis not present

## 2023-05-25 DIAGNOSIS — E782 Mixed hyperlipidemia: Secondary | ICD-10-CM | POA: Diagnosis not present

## 2023-05-25 DIAGNOSIS — G4733 Obstructive sleep apnea (adult) (pediatric): Secondary | ICD-10-CM | POA: Diagnosis not present

## 2023-05-25 DIAGNOSIS — E119 Type 2 diabetes mellitus without complications: Secondary | ICD-10-CM | POA: Diagnosis not present

## 2023-05-25 DIAGNOSIS — Z0001 Encounter for general adult medical examination with abnormal findings: Secondary | ICD-10-CM | POA: Diagnosis not present

## 2023-05-25 DIAGNOSIS — E669 Obesity, unspecified: Secondary | ICD-10-CM | POA: Diagnosis not present

## 2023-05-25 DIAGNOSIS — I10 Essential (primary) hypertension: Secondary | ICD-10-CM | POA: Diagnosis not present

## 2023-05-25 DIAGNOSIS — Z6841 Body Mass Index (BMI) 40.0 and over, adult: Secondary | ICD-10-CM | POA: Diagnosis not present

## 2023-06-23 DIAGNOSIS — G4730 Sleep apnea, unspecified: Secondary | ICD-10-CM | POA: Diagnosis not present

## 2023-07-14 DIAGNOSIS — G4730 Sleep apnea, unspecified: Secondary | ICD-10-CM | POA: Diagnosis not present

## 2023-07-14 DIAGNOSIS — E119 Type 2 diabetes mellitus without complications: Secondary | ICD-10-CM | POA: Diagnosis not present

## 2023-07-14 DIAGNOSIS — Z0001 Encounter for general adult medical examination with abnormal findings: Secondary | ICD-10-CM | POA: Diagnosis not present

## 2023-07-14 DIAGNOSIS — I10 Essential (primary) hypertension: Secondary | ICD-10-CM | POA: Diagnosis not present

## 2023-07-14 DIAGNOSIS — G4733 Obstructive sleep apnea (adult) (pediatric): Secondary | ICD-10-CM | POA: Diagnosis not present

## 2023-08-13 DIAGNOSIS — G4733 Obstructive sleep apnea (adult) (pediatric): Secondary | ICD-10-CM | POA: Diagnosis not present

## 2023-08-31 DIAGNOSIS — Z125 Encounter for screening for malignant neoplasm of prostate: Secondary | ICD-10-CM | POA: Diagnosis not present

## 2023-08-31 DIAGNOSIS — Z6841 Body Mass Index (BMI) 40.0 and over, adult: Secondary | ICD-10-CM | POA: Diagnosis not present

## 2023-08-31 DIAGNOSIS — Z1211 Encounter for screening for malignant neoplasm of colon: Secondary | ICD-10-CM | POA: Diagnosis not present

## 2023-08-31 DIAGNOSIS — I10 Essential (primary) hypertension: Secondary | ICD-10-CM | POA: Diagnosis not present

## 2023-09-06 DIAGNOSIS — I10 Essential (primary) hypertension: Secondary | ICD-10-CM | POA: Diagnosis not present

## 2023-09-06 DIAGNOSIS — Z6841 Body Mass Index (BMI) 40.0 and over, adult: Secondary | ICD-10-CM | POA: Diagnosis not present

## 2023-09-06 DIAGNOSIS — G4733 Obstructive sleep apnea (adult) (pediatric): Secondary | ICD-10-CM | POA: Diagnosis not present

## 2023-09-06 DIAGNOSIS — E119 Type 2 diabetes mellitus without complications: Secondary | ICD-10-CM | POA: Diagnosis not present

## 2023-09-13 DIAGNOSIS — G4733 Obstructive sleep apnea (adult) (pediatric): Secondary | ICD-10-CM | POA: Diagnosis not present

## 2023-09-22 DIAGNOSIS — F5221 Male erectile disorder: Secondary | ICD-10-CM | POA: Diagnosis not present

## 2023-09-22 DIAGNOSIS — N529 Male erectile dysfunction, unspecified: Secondary | ICD-10-CM | POA: Diagnosis not present

## 2023-10-13 DIAGNOSIS — G4733 Obstructive sleep apnea (adult) (pediatric): Secondary | ICD-10-CM | POA: Diagnosis not present

## 2023-10-27 DIAGNOSIS — M25511 Pain in right shoulder: Secondary | ICD-10-CM | POA: Diagnosis not present

## 2023-10-28 ENCOUNTER — Encounter (HOSPITAL_COMMUNITY): Payer: Self-pay

## 2023-10-28 ENCOUNTER — Other Ambulatory Visit: Payer: Self-pay

## 2023-10-28 ENCOUNTER — Emergency Department (HOSPITAL_COMMUNITY)
Admission: EM | Admit: 2023-10-28 | Discharge: 2023-10-28 | Disposition: A | Discharge status: Home / Self Care | Payer: BC Managed Care – PPO | Attending: Emergency Medicine | Admitting: Emergency Medicine

## 2023-10-28 DIAGNOSIS — E119 Type 2 diabetes mellitus without complications: Secondary | ICD-10-CM | POA: Diagnosis not present

## 2023-10-28 DIAGNOSIS — S161XXA Strain of muscle, fascia and tendon at neck level, initial encounter: Secondary | ICD-10-CM | POA: Diagnosis not present

## 2023-10-28 DIAGNOSIS — Y92410 Unspecified street and highway as the place of occurrence of the external cause: Secondary | ICD-10-CM | POA: Diagnosis not present

## 2023-10-28 DIAGNOSIS — Z7984 Long term (current) use of oral hypoglycemic drugs: Secondary | ICD-10-CM | POA: Diagnosis not present

## 2023-10-28 DIAGNOSIS — S199XXA Unspecified injury of neck, initial encounter: Secondary | ICD-10-CM | POA: Diagnosis not present

## 2023-10-28 MED ORDER — NAPROXEN 250 MG PO TABS
500.0000 mg | ORAL_TABLET | Freq: Once | ORAL | Status: AC
  Administered 2023-10-28: 500 mg via ORAL
  Filled 2023-10-28: qty 2

## 2023-10-28 MED ORDER — MELOXICAM 15 MG PO TABS: 15.0000 mg | ORAL_TABLET | Freq: Every day | ORAL | 0 refills | Status: AC

## 2023-10-28 MED ORDER — METHOCARBAMOL 500 MG PO TABS: 500.0000 mg | ORAL_TABLET | Freq: Two times a day (BID) | ORAL | 0 refills | Status: AC | PRN

## 2023-10-28 NOTE — Discharge Instructions (Addendum)
 Please take Mobic,  once daily as needed for pain - this in an antiinflammatory medicine (NSAID) and is similar to ibuprofen - many people feel that it is stronger than ibuprofen and it is easier to take since it is a smaller pill.  Please use this only for 1 week - if your pain persists, you will need to follow up with your doctor in the office for ongoing guidance and pain control.  Please take Robaxin, 500 mg up to 2 or 3 times a day as needed for muscle spasm, this is a muscle relaxer, it may cause generalized weakness, sleepiness and you should not drive or do important things while taking this medication.  This includes driving a vehicle or taking care of young children, these things should not be done while taking this medication.   Thank you for allowing [place] to treat you in the emergency department today.  After reviewing your examination and potential testing that was done it appears that you are safe to go home.  I would like for you to follow-up with your doctor within the next several days, have them obtain your records and follow-up with them to review all potential tests and results from your visit.  If you should develop severe or worsening symptoms return to the emergency department immediately
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
## 2023-11-13 DIAGNOSIS — G4733 Obstructive sleep apnea (adult) (pediatric): Secondary | ICD-10-CM | POA: Diagnosis not present

## 2023-11-30 ENCOUNTER — Encounter (HOSPITAL_COMMUNITY): Payer: Self-pay

## 2023-11-30 ENCOUNTER — Emergency Department (HOSPITAL_COMMUNITY)
Admission: EM | Admit: 2023-11-30 | Discharge: 2023-11-30 | Disposition: A | Discharge status: Home / Self Care | Payer: BC Managed Care – PPO | Attending: Emergency Medicine | Admitting: Emergency Medicine

## 2023-11-30 ENCOUNTER — Emergency Department (HOSPITAL_COMMUNITY): Payer: BC Managed Care – PPO

## 2023-11-30 ENCOUNTER — Other Ambulatory Visit: Payer: Self-pay

## 2023-11-30 DIAGNOSIS — M25552 Pain in left hip: Secondary | ICD-10-CM | POA: Diagnosis not present

## 2023-11-30 DIAGNOSIS — M25559 Pain in unspecified hip: Secondary | ICD-10-CM | POA: Diagnosis not present

## 2023-11-30 DIAGNOSIS — Y92410 Unspecified street and highway as the place of occurrence of the external cause: Secondary | ICD-10-CM | POA: Diagnosis not present

## 2023-11-30 DIAGNOSIS — M7918 Myalgia, other site: Secondary | ICD-10-CM

## 2023-11-30 DIAGNOSIS — I10 Essential (primary) hypertension: Secondary | ICD-10-CM | POA: Diagnosis not present

## 2023-11-30 DIAGNOSIS — M25519 Pain in unspecified shoulder: Secondary | ICD-10-CM | POA: Diagnosis not present

## 2023-11-30 DIAGNOSIS — M25512 Pain in left shoulder: Secondary | ICD-10-CM | POA: Insufficient documentation

## 2023-11-30 DIAGNOSIS — M19012 Primary osteoarthritis, left shoulder: Secondary | ICD-10-CM | POA: Diagnosis not present

## 2023-11-30 DIAGNOSIS — M47816 Spondylosis without myelopathy or radiculopathy, lumbar region: Secondary | ICD-10-CM | POA: Diagnosis not present

## 2023-11-30 DIAGNOSIS — M791 Myalgia, unspecified site: Secondary | ICD-10-CM | POA: Diagnosis not present
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
## 2023-12-06 DIAGNOSIS — M25552 Pain in left hip: Secondary | ICD-10-CM | POA: Diagnosis not present

## 2023-12-06 DIAGNOSIS — M25512 Pain in left shoulder: Secondary | ICD-10-CM | POA: Diagnosis not present

## 2023-12-06 DIAGNOSIS — T1490XA Injury, unspecified, initial encounter: Secondary | ICD-10-CM | POA: Diagnosis not present

## 2023-12-14 DIAGNOSIS — G4733 Obstructive sleep apnea (adult) (pediatric): Secondary | ICD-10-CM | POA: Diagnosis not present

## 2024-01-04 DIAGNOSIS — Z6841 Body Mass Index (BMI) 40.0 and over, adult: Secondary | ICD-10-CM | POA: Diagnosis not present

## 2024-01-05 LAB — EXTERNAL GENERIC LAB PROCEDURE

## 2024-01-11 DIAGNOSIS — G4733 Obstructive sleep apnea (adult) (pediatric): Secondary | ICD-10-CM | POA: Diagnosis not present

## 2024-01-11 DIAGNOSIS — E119 Type 2 diabetes mellitus without complications: Secondary | ICD-10-CM | POA: Diagnosis not present

## 2024-01-11 DIAGNOSIS — E782 Mixed hyperlipidemia: Secondary | ICD-10-CM | POA: Diagnosis not present

## 2024-01-11 DIAGNOSIS — Z6841 Body Mass Index (BMI) 40.0 and over, adult: Secondary | ICD-10-CM | POA: Diagnosis not present

## 2024-01-11 DIAGNOSIS — I10 Essential (primary) hypertension: Secondary | ICD-10-CM | POA: Diagnosis not present

## 2024-01-25 LAB — COLOGUARD: COLOGUARD: POSITIVE — AB

## 2024-01-25 LAB — EXTERNAL GENERIC LAB PROCEDURE: COLOGUARD: POSITIVE — AB

## 2024-02-02 ENCOUNTER — Encounter: Payer: Self-pay | Admitting: *Deleted

## 2024-02-11 DIAGNOSIS — G4733 Obstructive sleep apnea (adult) (pediatric): Secondary | ICD-10-CM | POA: Diagnosis not present

## 2024-02-21 ENCOUNTER — Encounter: Payer: Self-pay | Admitting: Gastroenterology

## 2024-02-21 ENCOUNTER — Ambulatory Visit (INDEPENDENT_AMBULATORY_CARE_PROVIDER_SITE_OTHER): Admitting: Gastroenterology

## 2024-02-21 VITALS — BP 137/88 | HR 71 | Temp 98.3°F | Ht 72.0 in | Wt 326.6 lb

## 2024-02-21 DIAGNOSIS — R195 Other fecal abnormalities: Secondary | ICD-10-CM | POA: Diagnosis not present

## 2024-02-21 DIAGNOSIS — Z800 Family history of malignant neoplasm of digestive organs: Secondary | ICD-10-CM

## 2024-02-21 NOTE — Patient Instructions (Signed)
 We are arranging a colonoscopy with Dr. Sammi Crick in the near future!  I recommend supplemental fiber such as Benefiber daily.  Continue to avoid greasy, fatty foods.  Further recommendations to follow!  It was a pleasure to see you today. I want to create trusting relationships with patients and provide genuine, compassionate, and quality care. I truly value your feedback, so please be on the lookout for a survey regarding your visit with me today. I appreciate your time in completing this!         Delman Ferns, PhD, ANP-BC [facility]

## 2024-02-21 NOTE — Progress Notes (Addendum)
 Gastroenterology Office Note    Referring Provider: Omie Bickers, MD Primary Care Physician:  Omie Bickers, MD  Primary GI: Dr. Sammi Crick    Chief Complaint   Chief Complaint  Patient presents with   Diarrhea    Patient here today due to having issues with diarrhea. Symptoms on going since changing his diet,back to eating greasy fried foods. He is in need of a Tcs. He is not taking any anti diarrheals.     History of Present Illness   Timothy Dalton is a 58 y.o. male presenting today at the request of Omie Bickers, MD due to positive cologuard test. Medical history significant for HTN, pancreatitis due to gallstones s/p cholecystectomy in 2017, arthritis, diabetes now controlled by diet.  No prior colonoscopy.   He notes chronic looser stools, dating back to around 2017 s/p cholecystectomy.  Just once per day. Sometimes formed BM. When diagnosed with diabetes, cut out fried foods and had better consistency of stools. No chronic GERD. When eating healthier, indigestion improved. When diagnosed with diabetes, lost about 59 lbs. No dysphagia. No abdominal pain.   Remote history of rectal bleeding but none currently. Prolapsing rectal tissue at times.   Maternal grandmother: colon cancer No FH colon polyps  Past Medical History:  Diagnosis Date   Arthritis    Diabetes (HCC)    Gallstone    HTN (hypertension)    Pancreatitis     Past Surgical History:  Procedure Laterality Date   CHOLECYSTECTOMY N/A 04/30/2016   Procedure: LAPAROSCOPIC CHOLECYSTECTOMY;  Surgeon: Franki Isles, MD;  Location: [facility];  Service: General;  Laterality: N/A;   KNEE ARTHROSCOPY Left 11/15/2022   Procedure: LEFT KNEE ARTHROSCOPY with partial medial and lateral menisectomy;  Surgeon: Wendolyn Hamburger, MD;  Location: [facility];  Service: Orthopedics;  Laterality: Left;   ROTATOR CUFF REPAIR      Current Outpatient Medications  Medication Sig Dispense Refill   amLODipine  (NORVASC ) 10 MG tablet Take 1  tablet (10 mg total) by mouth daily. 30 tablet 0   blood glucose meter kit and supplies Dispense based on patient and insurance preference. Use up to four times daily as directed. (FOR ICD-10 E10.9, E11.9). 1 each 0   losartan  (COZAAR ) 100 MG tablet Take 1 tablet (100 mg total) by mouth daily. 30 tablet 1   methocarbamol  (ROBAXIN ) 500 MG tablet Take 1 tablet (500 mg total) by mouth 2 (two) times daily as needed for muscle spasms. 20 tablet 0   No current facility-administered medications for this visit.    Allergies as of 02/21/2024   (No Known Allergies)    Family History  Problem Relation Age of Onset   Colon cancer Maternal Grandmother    Pancreatic disease Neg Hx    Liver disease Neg Hx    Colon polyps Neg Hx     Social History   Socioeconomic History   Marital status: Married    Spouse name: Not on file   Number of children: Not on file   Years of education: Not on file   Highest education level: Not on file  Occupational History   Not on file  Tobacco Use   Smoking status: Former    Current packs/day: 0.00    Types: Cigarettes    Quit date: 04/21/2022    Years since quitting: 1.8   Smokeless tobacco: Never  Vaping Use   Vaping status: Never Used  Substance and Sexual Activity   Alcohol use:  Not Currently    Comment: denies any use for past 3-4 wks   Drug use: No   Sexual activity: Not on file  Other Topics Concern   Not on file  Social History Narrative   Not on file   Social Drivers of Health   Financial Resource Strain: Not on file  Food Insecurity: No Food Insecurity (03/05/2023)   Hunger Vital Sign    Worried About Running Out of Food in the Last Year: Never true    Ran Out of Food in the Last Year: Never true  Transportation Needs: No Transportation Needs (03/05/2023)   PRAPARE - Administrator, Civil Service (Medical): No    Lack of Transportation (Non-Medical): No  Physical Activity: Not on file  Stress: Not on file  Social  Connections: Unknown (03/02/2022)   [facility]   Social Network    Social Network: Not on file  Intimate Partner Violence: Not At Risk (03/05/2023)   Humiliation, Afraid, Rape, and Kick questionnaire    Fear of Current or Ex-Partner: No    Emotionally Abused: No    Physically Abused: No    Sexually Abused: No     Review of Systems   Gen: Denies any fever, chills, fatigue, weight loss, lack of appetite.  CV: Denies chest pain, heart palpitations, peripheral edema, syncope.  Resp: Denies shortness of breath at rest or with exertion. Denies wheezing or cough.  GI: Denies dysphagia or odynophagia. Denies jaundice, hematemesis, fecal incontinence. GU : Denies urinary burning, urinary frequency, urinary hesitancy MS: Denies joint pain, muscle weakness, cramps, or limitation of movement.  Derm: Denies rash, itching, dry skin Psych: Denies depression, anxiety, memory loss, and confusion Heme: Denies bruising, bleeding, and enlarged lymph nodes.   Physical Exam   BP 137/88 (BP Location: Right Arm, Patient Position: Sitting, Cuff Size: Large)   Pulse 71   Temp 98.3 F (36.8 C) (Temporal)   Ht 6' (1.829 m)   Wt (!) 326 lb 9.6 oz (148.1 kg)   BMI 44.29 kg/m  General:   Alert and oriented. Pleasant and cooperative. Well-nourished and well-developed.  Head:  Normocephalic and atraumatic. Eyes:  Without icterus Ears:  Normal auditory acuity. Lungs:  Clear to auscultation bilaterally.  Heart:  S1, S2 present without murmurs appreciated.  Abdomen:  +BS, soft, non-tender and non-distended. No HSM noted. No guarding or rebound. No masses appreciated.  Rectal:  Deferred  Msk:  Symmetrical without gross deformities. Normal posture. Extremities:  Without edema. Neurologic:  Alert and  oriented x4;  grossly normal neurologically. Skin:  Intact without significant lesions or rashes. Psych:  Alert and cooperative. Normal mood and affect.   Assessment   Timothy Dalton is a 58 y.o. male presenting today at the request of Omie Bickers, MD due to positive cologuard test, with PMH significant for HTN, pancreatitis due to gallstones s/p cholecystectomy in 2017, arthritis, diabetes now controlled by diet.    Noting chronic looser stools s/p cholecystectomy that responds well to healthy diet and avoidance of fatty/greasy foods. Prior remote rectal bleeding likely r/t internal hemorrhoids or other benign source. Due to positive cologuard, needs diagnostic colonoscopy in the near future. No prior colonoscopy. No FH colon polyps, and no first-degree relatives with colon cancer (maternal grandmother had colon cancer).     PLAN   Proceed with colonoscopy by Dr. Sammi Crick  in near future: the risks, benefits, and alternatives have been discussed with the patient in  detail. The patient states understanding and desires to proceed. ASA 3  Can consider hemorrhoid banding if symptomatic in future  Further recommendations to follow     Delman Ferns, PhD, ANP-BC [facility]   I have reviewed the note and agree with the APP's assessment as described in this progress note  Samantha Cress, MD Gastroenterology and Hepatology [facility]

## 2024-02-23 ENCOUNTER — Telehealth: Payer: Self-pay | Admitting: *Deleted

## 2024-02-23 NOTE — Telephone Encounter (Signed)
 LMOVM to return call  TCS w/Dr.Castaneda, asa 3

## 2024-02-24 ENCOUNTER — Encounter: Payer: Self-pay | Admitting: *Deleted

## 2024-02-24 ENCOUNTER — Other Ambulatory Visit: Payer: Self-pay | Admitting: *Deleted

## 2024-02-24 MED ORDER — PEG 3350-KCL-NA BICARB-NACL 420 G PO SOLR: 4000.0000 mL | Freq: Once | ORAL | 0 refills | Status: AC

## 2024-02-24 NOTE — Telephone Encounter (Signed)
 Pt has been scheduled for 03/27/24. Instructions mailed and prep sent to the pharmacy.

## 2024-02-24 NOTE — Telephone Encounter (Signed)
 LMOVM to return call  Pt had left vm returning call

## 2024-03-07 ENCOUNTER — Other Ambulatory Visit (HOSPITAL_COMMUNITY): Payer: Self-pay | Admitting: Nurse Practitioner

## 2024-03-07 DIAGNOSIS — Z122 Encounter for screening for malignant neoplasm of respiratory organs: Secondary | ICD-10-CM

## 2024-03-12 DIAGNOSIS — G4733 Obstructive sleep apnea (adult) (pediatric): Secondary | ICD-10-CM | POA: Diagnosis not present

## 2024-03-16 ENCOUNTER — Ambulatory Visit (HOSPITAL_COMMUNITY)

## 2024-03-16 ENCOUNTER — Encounter (HOSPITAL_COMMUNITY): Payer: Self-pay

## 2024-03-22 ENCOUNTER — Telehealth (INDEPENDENT_AMBULATORY_CARE_PROVIDER_SITE_OTHER): Payer: Self-pay | Admitting: *Deleted

## 2024-03-22 NOTE — Telephone Encounter (Signed)
 Message Received: Today [name], [name], LPN This patient LM to reschedule his colonoscopy, I have placed in the depot.  Thanks,

## 2024-03-22 NOTE — Patient Instructions (Signed)
   Your procedure is scheduled on: 03/27/2024  Report to [facility] Main Entrance at   8:00  AM.  Call this number if you have problems the morning of surgery: [phone]   Remember:              Follow Directions on the letter you received from Your Physician's office regarding the Bowel Prep              No Smoking the day of Procedure :   Take these medicines the morning of surgery with A SIP OF WATER: Amlodipine  and Robaxin  if needed   Do not wear jewelry, make-up or nail polish.    Do not bring valuables to the hospital.  Contacts, dentures or bridgework may not be worn into surgery.  .   Patients discharged the day of surgery will not be allowed to drive home.     Colonoscopy, Adult, Care After This sheet gives you information about how to care for yourself after your procedure. Your health care provider may also give you more specific instructions. If you have problems or questions, contact your health care provider. What can I expect after the procedure? After the procedure, it is common to have: A small amount of blood in your stool for 24 hours after the procedure. Some gas. Mild abdominal cramping or bloating.  Follow these instructions at home: General instructions  For the first 24 hours after the procedure: Do not drive or use machinery. Do not sign important documents. Do not drink alcohol. Do your regular daily activities at a slower pace than normal. Eat soft, easy-to-digest foods. Rest often. Take over-the-counter or prescription medicines only as told by your health care provider. It is up to you to get the results of your procedure. Ask your health care provider, or the department performing the procedure, when your results will be ready. Relieving cramping and bloating Try walking around when you have cramps or feel bloated. Apply heat to your abdomen as told by your health care provider. Use a heat source that your health care provider recommends,  such as a moist heat pack or a heating pad. Place a towel between your skin and the heat source. Leave the heat on for 20-30 minutes. Remove the heat if your skin turns bright red. This is especially important if you are unable to feel pain, heat, or cold. You may have a greater risk of getting burned. Eating and drinking Drink enough fluid to keep your urine clear or pale yellow. Resume your normal diet as instructed by your health care provider. Avoid heavy or fried foods that are hard to digest. Avoid drinking alcohol for as long as instructed by your health care provider. Contact a health care provider if: You have blood in your stool 2-3 days after the procedure. Get help right away if: You have more than a small spotting of blood in your stool. You pass large blood clots in your stool. Your abdomen is swollen. You have nausea or vomiting. You have a fever. You have increasing abdominal pain that is not relieved with medicine. This information is not intended to replace advice given to you by your health care provider. Make sure you discuss any questions you have with your health care provider. Document Released: 05/18/2004 Document Revised: 06/28/2016 Document Reviewed: 12/16/2015 Elsevier Interactive Patient Education  [employer].

## 2024-03-22 NOTE — Telephone Encounter (Signed)
 LMOVM to return call.

## 2024-03-22 NOTE — Telephone Encounter (Signed)
 Pt thought his procedure was for 03/23/24 but was informed it was scheduled for 03/27/24. He was informed that his pre-op is for 03/23/24 and procedure is on 03/27/24. He says he is fine with leaving the procedure on 03/27/24 and does not want to reschedule. He had also called endo back and was put back on the schedule.

## 2024-03-23 ENCOUNTER — Encounter (HOSPITAL_COMMUNITY): Payer: Self-pay

## 2024-03-23 ENCOUNTER — Encounter (HOSPITAL_COMMUNITY)

## 2024-03-23 ENCOUNTER — Other Ambulatory Visit: Payer: Self-pay

## 2024-03-23 ENCOUNTER — Encounter (HOSPITAL_COMMUNITY)
Admission: RE | Admit: 2024-03-23 | Discharge: 2024-03-23 | Disposition: A | Discharge status: Home / Self Care | Source: Ambulatory Visit | Attending: Internal Medicine | Admitting: Internal Medicine

## 2024-03-23 VITALS — BP 125/79 | HR 72 | Resp 18 | Ht 72.0 in | Wt 326.5 lb

## 2024-03-23 DIAGNOSIS — I10 Essential (primary) hypertension: Secondary | ICD-10-CM | POA: Insufficient documentation

## 2024-03-23 DIAGNOSIS — E119 Type 2 diabetes mellitus without complications: Secondary | ICD-10-CM | POA: Diagnosis not present

## 2024-03-23 DIAGNOSIS — Z01818 Encounter for other preprocedural examination: Secondary | ICD-10-CM | POA: Diagnosis not present

## 2024-03-23 LAB — BASIC METABOLIC PANEL WITH GFR
Anion gap: 11 (ref 5–15)
BUN: 15 mg/dL (ref 6–20)
CO2: 25 mmol/L (ref 22–32)
Calcium: 9.4 mg/dL (ref 8.9–10.3)
Chloride: 103 mmol/L (ref 98–111)
Creatinine, Ser: 0.88 mg/dL (ref 0.61–1.24)
GFR, Estimated: >60 mL/min (ref >60)
Glucose, Bld: 97 mg/dL (ref 70–99)
Potassium: 4 mmol/L (ref 3.5–5.1)
Sodium: 139 mmol/L (ref 135–145)

## 2024-03-26 ENCOUNTER — Encounter: Payer: Self-pay | Admitting: *Deleted

## 2024-03-26 ENCOUNTER — Telehealth: Payer: Self-pay | Admitting: *Deleted

## 2024-03-26 NOTE — Telephone Encounter (Signed)
 Pt came by the office and says he can't do the 04/03/24 due to it being fathers day on 04/01/24 and his family takes him out to eat. He has been rescheduled to 04/13/24 at 11 am. Updated instructions printed for pt.

## 2024-03-26 NOTE — Telephone Encounter (Signed)
 Endo has been notified

## 2024-03-26 NOTE — Telephone Encounter (Signed)
 Thanks, please notify the endo team Thanks

## 2024-03-26 NOTE — Telephone Encounter (Signed)
 Pt has been rescheduled from 03/27/24 to 04/03/24 at 9:30 am. He says he didn't start his clear liquid diet yesterday.

## 2024-03-26 NOTE — Telephone Encounter (Signed)
 Pt informed that pre-op telephone call will be done on Wednesday 04/11/24.

## 2024-03-27 DIAGNOSIS — E119 Type 2 diabetes mellitus without complications: Secondary | ICD-10-CM

## 2024-03-27 DIAGNOSIS — I10 Essential (primary) hypertension: Secondary | ICD-10-CM

## 2024-03-30 ENCOUNTER — Other Ambulatory Visit (HOSPITAL_COMMUNITY)

## 2024-04-10 DIAGNOSIS — Z6841 Body Mass Index (BMI) 40.0 and over, adult: Secondary | ICD-10-CM | POA: Diagnosis not present

## 2024-04-10 DIAGNOSIS — I10 Essential (primary) hypertension: Secondary | ICD-10-CM | POA: Diagnosis not present

## 2024-04-11 ENCOUNTER — Encounter (HOSPITAL_COMMUNITY): Payer: Self-pay | Admitting: Anesthesiology

## 2024-04-11 ENCOUNTER — Telehealth: Payer: Self-pay | Admitting: *Deleted

## 2024-04-11 ENCOUNTER — Encounter (HOSPITAL_COMMUNITY)
Admission: RE | Admit: 2024-04-11 | Discharge: 2024-04-11 | Disposition: A | Discharge status: Home / Self Care | Source: Ambulatory Visit | Attending: Gastroenterology | Admitting: Gastroenterology

## 2024-04-11 NOTE — Telephone Encounter (Signed)
 Pt wanted to reschedule for August. Advised pt that we don't have providers schedule for August, will call to schedule once we get it. Pt verbalized understanding.

## 2024-04-11 NOTE — Pre-Procedure Instructions (Signed)
 Called patient for PAT visit today for upcoming colonoscopy. Patient states he wants to change colonoscopy date. I instructed him to call Dr.Castaneda's office to get new appointment for colonoscopy and he will also get new nurse PAT phone call. Pt verbalizes understanding.

## 2024-04-11 NOTE — Telephone Encounter (Signed)
 Message Received: Today Timothy Dalton DEL, RN  [name], [name], LPN Patient request to change colonoscopy date. I instructed him to call Dr.Castaneda's office to make new appointment and that he would also need new PAT appointment. Pt states he will call office today. Thanks, [employer]

## 2024-04-11 NOTE — Telephone Encounter (Signed)
 Thanks for the update

## 2024-04-12 DIAGNOSIS — G4733 Obstructive sleep apnea (adult) (pediatric): Secondary | ICD-10-CM | POA: Diagnosis not present

## 2024-04-13 ENCOUNTER — Encounter (HOSPITAL_COMMUNITY): Admission: RE | Payer: Self-pay | Source: Home / Self Care

## 2024-04-13 ENCOUNTER — Ambulatory Visit (HOSPITAL_COMMUNITY): Admission: RE | Admit: 2024-04-13 | Source: Home / Self Care | Admitting: Gastroenterology

## 2024-04-13 DIAGNOSIS — I10 Essential (primary) hypertension: Secondary | ICD-10-CM

## 2024-04-13 DIAGNOSIS — E119 Type 2 diabetes mellitus without complications: Secondary | ICD-10-CM

## 2024-04-13 SURGERY — COLONOSCOPY
Anesthesia: Choice

## 2024-04-17 DIAGNOSIS — E119 Type 2 diabetes mellitus without complications: Secondary | ICD-10-CM | POA: Diagnosis not present

## 2024-04-17 DIAGNOSIS — I10 Essential (primary) hypertension: Secondary | ICD-10-CM | POA: Diagnosis not present

## 2024-04-17 DIAGNOSIS — R748 Abnormal levels of other serum enzymes: Secondary | ICD-10-CM | POA: Diagnosis not present

## 2024-04-17 DIAGNOSIS — M25511 Pain in right shoulder: Secondary | ICD-10-CM | POA: Diagnosis not present

## 2024-04-17 DIAGNOSIS — E782 Mixed hyperlipidemia: Secondary | ICD-10-CM | POA: Diagnosis not present

## 2024-04-26 DIAGNOSIS — Z20822 Contact with and (suspected) exposure to covid-19: Secondary | ICD-10-CM | POA: Diagnosis not present

## 2024-04-26 DIAGNOSIS — R058 Other specified cough: Secondary | ICD-10-CM | POA: Diagnosis not present

## 2024-04-26 NOTE — Telephone Encounter (Signed)
 Ideally, should wait at least 4 weeks after having his procedure so he has no discomfort when positioned for the procedure. Thanks

## 2024-04-26 NOTE — Telephone Encounter (Signed)
 Spoke with pt. Scheduled for shoulder arthroscopy with rotator cuff repair 7/23. Does he need to wait so many weeks after this appointment before having procedure done?

## 2024-04-26 NOTE — Telephone Encounter (Signed)
 Noted. Will call patient to schedule for beg sept once we get schedule

## 2024-05-12 DIAGNOSIS — G4733 Obstructive sleep apnea (adult) (pediatric): Secondary | ICD-10-CM | POA: Diagnosis not present

## 2024-05-25 MED ORDER — PEG 3350-KCL-NA BICARB-NACL 420 G PO SOLR: 4000.0000 mL | Freq: Once | ORAL | 0 refills | Status: AC

## 2024-05-25 NOTE — Addendum Note (Signed)
 Addended by: JEANELL GRAEME RAMAN on: 05/25/2024 10:44 AM   Modules accepted: Orders

## 2024-05-25 NOTE — Telephone Encounter (Signed)
 Spoke with pt. Scheduled for 9/30. He wanted to wait until the very end of sept. Aware will send instructions/pre-op appt in the mail. Rx for prep to the pharmacy.

## 2024-05-28 ENCOUNTER — Encounter: Payer: Self-pay | Admitting: *Deleted

## 2024-07-13 ENCOUNTER — Other Ambulatory Visit: Payer: Self-pay

## 2024-07-13 ENCOUNTER — Encounter (HOSPITAL_COMMUNITY): Payer: Self-pay

## 2024-07-13 ENCOUNTER — Encounter (HOSPITAL_COMMUNITY)
Admission: RE | Admit: 2024-07-13 | Discharge: 2024-07-13 | Disposition: A | Discharge status: Home / Self Care | Source: Ambulatory Visit | Attending: Gastroenterology | Admitting: Gastroenterology

## 2024-07-16 NOTE — Anesthesia Preprocedure Evaluation (Signed)
 Anesthesia Evaluation  Patient identified by MRN, date of birth, ID band Patient awake    Reviewed: Allergy & Precautions, NPO status , Patient's Chart, lab work & pertinent test results  Airway Mallampati: IV  TM Distance: >3 FB Neck ROM: Full    Dental  (+) Dental Advisory Given, Poor Dentition, Missing, Loose,  Lower left front incisor is loose:   Pulmonary sleep apnea , Patient abstained from smoking., former smoker   Pulmonary exam normal breath sounds clear to auscultation       Cardiovascular hypertension, Pt. on medications Normal cardiovascular exam Rhythm:Regular Rate:Normal     Neuro/Psych negative neurological ROS  negative psych ROS   GI/Hepatic Neg liver ROS,,,  Endo/Other  diabetes  Class 3 obesity  Renal/GU negative Renal ROS  negative genitourinary   Musculoskeletal  (+) Arthritis , Osteoarthritis,  TMM left knee   Abdominal  (+) + obese  Peds  Hematology negative hematology ROS (+)   Anesthesia Other Findings pancreatitis  Reproductive/Obstetrics                              Anesthesia Physical Anesthesia Plan  ASA: 3  Anesthesia Plan: General   Post-op Pain Management: Minimal or no pain anticipated   Induction: Intravenous  PONV Risk Score and Plan:   Airway Management Planned: Nasal Cannula and Natural Airway  Additional Equipment: None  Intra-op Plan:   Post-operative Plan:   Informed Consent: I have reviewed the patients History and Physical, chart, labs and discussed the procedure including the risks, benefits and alternatives for the proposed anesthesia with the patient or authorized representative who has indicated his/her understanding and acceptance.     Dental advisory given  Plan Discussed with: CRNA  Anesthesia Plan Comments:          Anesthesia Quick Evaluation

## 2024-07-17 ENCOUNTER — Ambulatory Visit (HOSPITAL_COMMUNITY)
Admission: RE | Admit: 2024-07-17 | Discharge: 2024-07-17 | Disposition: A | Discharge status: Home / Self Care | Attending: Gastroenterology | Admitting: Gastroenterology

## 2024-07-17 ENCOUNTER — Ambulatory Visit (HOSPITAL_COMMUNITY): Payer: Self-pay | Admitting: Anesthesiology

## 2024-07-17 ENCOUNTER — Encounter (HOSPITAL_COMMUNITY)
Admission: RE | Disposition: A | Discharge status: Home / Self Care | Payer: Self-pay | Source: Home / Self Care | Attending: Gastroenterology

## 2024-07-17 ENCOUNTER — Encounter (HOSPITAL_COMMUNITY): Payer: Self-pay | Admitting: Gastroenterology

## 2024-07-17 ENCOUNTER — Encounter (INDEPENDENT_AMBULATORY_CARE_PROVIDER_SITE_OTHER): Payer: Self-pay | Admitting: *Deleted

## 2024-07-17 ENCOUNTER — Other Ambulatory Visit: Payer: Self-pay

## 2024-07-17 DIAGNOSIS — Z79899 Other long term (current) drug therapy: Secondary | ICD-10-CM | POA: Insufficient documentation

## 2024-07-17 DIAGNOSIS — K5730 Diverticulosis of large intestine without perforation or abscess without bleeding: Secondary | ICD-10-CM | POA: Insufficient documentation

## 2024-07-17 DIAGNOSIS — M1990 Unspecified osteoarthritis, unspecified site: Secondary | ICD-10-CM | POA: Insufficient documentation

## 2024-07-17 DIAGNOSIS — Z87891 Personal history of nicotine dependence: Secondary | ICD-10-CM | POA: Insufficient documentation

## 2024-07-17 DIAGNOSIS — E119 Type 2 diabetes mellitus without complications: Secondary | ICD-10-CM | POA: Diagnosis not present

## 2024-07-17 DIAGNOSIS — Z1211 Encounter for screening for malignant neoplasm of colon: Secondary | ICD-10-CM | POA: Diagnosis not present

## 2024-07-17 DIAGNOSIS — I10 Essential (primary) hypertension: Secondary | ICD-10-CM | POA: Diagnosis not present

## 2024-07-17 DIAGNOSIS — R195 Other fecal abnormalities: Secondary | ICD-10-CM | POA: Diagnosis not present

## 2024-07-17 DIAGNOSIS — D124 Benign neoplasm of descending colon: Secondary | ICD-10-CM | POA: Insufficient documentation

## 2024-07-17 DIAGNOSIS — Z6841 Body Mass Index (BMI) 40.0 and over, adult: Secondary | ICD-10-CM | POA: Diagnosis not present

## 2024-07-17 DIAGNOSIS — D125 Benign neoplasm of sigmoid colon: Secondary | ICD-10-CM | POA: Diagnosis not present

## 2024-07-17 DIAGNOSIS — E66813 Obesity, class 3: Secondary | ICD-10-CM | POA: Insufficient documentation

## 2024-07-17 DIAGNOSIS — K635 Polyp of colon: Secondary | ICD-10-CM | POA: Diagnosis not present

## 2024-07-17 HISTORY — PX: COLONOSCOPY: SHX5424

## 2024-07-17 LAB — HM COLONOSCOPY

## 2024-07-17 LAB — GLUCOSE, CAPILLARY: Glucose-Capillary: 92 mg/dL (ref 70–99)

## 2024-07-17 SURGERY — COLONOSCOPY
Anesthesia: General

## 2024-07-17 MED ORDER — PROPOFOL 10 MG/ML IV BOLUS
INTRAVENOUS | Status: DC | PRN
  Administered 2024-07-17 (×8): 50 mg via INTRAVENOUS
  Administered 2024-07-17: 100 mg via INTRAVENOUS
  Administered 2024-07-17: 50 mg via INTRAVENOUS

## 2024-07-17 MED ORDER — LIDOCAINE HCL (CARDIAC) PF 100 MG/5ML IV SOSY
PREFILLED_SYRINGE | INTRAVENOUS | Status: DC | PRN
  Administered 2024-07-17: 100 mg via INTRAVENOUS

## 2024-07-17 MED ORDER — LACTATED RINGERS IV SOLN
INTRAVENOUS | Status: DC
Start: 2024-07-17 — End: 2024-07-17

## 2024-07-17 NOTE — Anesthesia Postprocedure Evaluation (Signed)
 Anesthesia Post Note  Patient: Timothy Dalton  Procedure(s) Performed: COLONOSCOPY  Patient location during evaluation: Phase II Anesthesia Type: General Level of consciousness: awake and alert Pain management: pain level controlled Vital Signs Assessment: post-procedure vital signs reviewed and stable Respiratory status: spontaneous breathing, nonlabored ventilation and respiratory function stable Cardiovascular status: stable Anesthetic complications: no   There were no known notable events for this encounter.   Last Vitals:  Vitals:   07/17/24 0706 07/17/24 0819  BP: 123/80 139/62  Pulse: 71 67  Resp: 17 18  Temp: 36.8 C 36.5 C  SpO2: 100% 97%    Last Pain:  Vitals:   07/17/24 0819  TempSrc: Axillary  PainSc: 0-No pain                 Imre Vecchione L Lexxie Winberg

## 2024-07-17 NOTE — Transfer of Care (Signed)
 Immediate Anesthesia Transfer of Care Note  Patient: Timothy Dalton  Procedure(s) Performed: COLONOSCOPY  Patient Location: Short Stay  Anesthesia Type:General  Level of Consciousness: awake, alert , oriented, and patient cooperative  Airway & Oxygen Therapy: Patient Spontanous Breathing  Post-op Assessment: Report given to RN and Post -op Vital signs reviewed and stable  Post vital signs: Reviewed and stable  Last Vitals:  Vitals Value Taken Time  BP 124/76   Temp 97.9   Pulse 74   Resp 16   SpO2 96     Last Pain:  Vitals:   07/17/24 0744  TempSrc:   PainSc: 0-No pain      Patients Stated Pain Goal: 5 (07/17/24 0706)  Complications: No notable events documented.

## 2024-07-17 NOTE — H&P (Signed)
 Timothy Dalton is an 58 y.o. male.   Chief Complaint: positive Cologuard HPI: 58 y/o M with PMH DM, arthritis, HTN, coming for positive Cologuard.  The patient has never had a colonoscopy in the past.  The patient denies having any complaints such as melena, hematochezia, abdominal pain or distention, change in her bowel movement consistency or frequency, no changes in weight recently.  No family history of colorectal cancer.   Past Medical History:  Diagnosis Date   Arthritis    Diabetes (HCC)    Gallstone    HTN (hypertension)    Pancreatitis     Past Surgical History:  Procedure Laterality Date   CHOLECYSTECTOMY N/A 04/30/2016   Procedure: LAPAROSCOPIC CHOLECYSTECTOMY;  Surgeon: Selinda Artist Moats, MD;  Location: [facility];  Service: General;  Laterality: N/A;   KNEE ARTHROSCOPY Left 11/15/2022   Procedure: LEFT KNEE ARTHROSCOPY with partial medial and lateral menisectomy;  Surgeon: Liam Lerner, MD;  Location: [facility];  Service: Orthopedics;  Laterality: Left;   ROTATOR CUFF REPAIR      Family History  Problem Relation Age of Onset   Colon cancer Maternal Grandmother    Pancreatic disease Neg Hx    Liver disease Neg Hx    Colon polyps Neg Hx    Social History:  reports that he quit smoking about 2 years ago. His smoking use included cigarettes. He has never used smokeless tobacco. He reports that he does not currently use alcohol. He reports that he does not use drugs.  Allergies: No Known Allergies  Medications Prior to Admission  Medication Sig Dispense Refill   blood glucose meter kit and supplies Dispense based on patient and insurance preference. Use up to four times daily as directed. (FOR ICD-10 E10.9, E11.9). 1 each 0   losartan  (COZAAR ) 100 MG tablet Take 1 tablet (100 mg total) by mouth daily. 30 tablet 1   methocarbamol  (ROBAXIN ) 500 MG tablet Take 1 tablet (500 mg total) by mouth 2 (two) times daily as needed for muscle spasms. 20 tablet 0   amLODipine  (NORVASC ) 10 MG  tablet Take 1 tablet (10 mg total) by mouth daily. 30 tablet 0    Results for orders placed or performed during the hospital encounter of 07/17/24 (from the past 48 hours)  Glucose, capillary     Status: None   Collection Time: 07/17/24  6:33 AM  Result Value Ref Range   Glucose-Capillary 92 70 - 99 mg/dL    Comment: Glucose reference range applies only to samples taken after fasting for at least 8 hours.   No results found.  Review of Systems  All other systems reviewed and are negative.   Blood pressure 123/80, pulse 71, temperature 98.3 F (36.8 C), temperature source Oral, resp. rate 17, SpO2 100%. Physical Exam  GENERAL: The patient is AO x3, in no acute distress. HEENT: Head is normocephalic and atraumatic. EOMI are intact. Mouth is well hydrated and without lesions. NECK: Supple. No masses LUNGS: Clear to auscultation. No presence of rhonchi/wheezing/rales. Adequate chest expansion HEART: RRR, normal s1 and s2. ABDOMEN: Soft, nontender, no guarding, no peritoneal signs, and nondistended. BS +. No masses. EXTREMITIES: Without any cyanosis, clubbing, rash, lesions or edema. NEUROLOGIC: AOx3, no focal motor deficit. SKIN: no jaundice, no rashes  Assessment/Plan 58 y/o M with PMH DM, arthritis, HTN, coming for positive Cologuard.  We will proceed with colonoscopy  Toribio Eartha Flavors, MD 07/17/2024, 7:32 AM

## 2024-07-17 NOTE — Discharge Instructions (Signed)
You are being discharged to home.  Resume your previous diet.  We are waiting for your pathology results.  Your physician has recommended a repeat colonoscopy (date to be determined after pending pathology results are reviewed) for screening purposes.  

## 2024-07-17 NOTE — Op Note (Signed)
 [facility] Patient Name: Timothy Dalton Procedure Date: 07/17/2024 7:00 AM MRN: [id number] Date of Birth: August 11, 1966 Attending MD: Toribio Fortune , , [id number] CSN: [id number] Age: 58 Admit Type: Outpatient Procedure:                Colonoscopy Indications:              Positive Cologuard test Providers:                Toribio Fortune, Harlene Lips, Crystal Page Referring MD:              Medicines:                Monitored Anesthesia Care Complications:            No immediate complications. Estimated Blood Loss:     Estimated blood loss: none. Procedure:                Pre-Anesthesia Assessment:                           - Prior to the procedure, a History and Physical                            was performed, and patient medications, allergies                            and sensitivities were reviewed. The patient's                            tolerance of previous anesthesia was reviewed.                           - The risks and benefits of the procedure and the                            sedation options and risks were discussed with the                            patient. All questions were answered and informed                            consent was obtained.                           - ASA Grade Assessment: II - A patient with mild                            systemic disease.                           After obtaining informed consent, the colonoscope                            was passed under direct vision. Throughout the                            procedure, the patient's blood pressure, pulse, and  oxygen saturations were monitored continuously. The                            [id number]-[id number] ([id number]) Peds Colon was introduced                            through the anus and advanced to the the cecum,                            identified by appendiceal orifice and ileocecal                            valve. The colonoscopy was performed  without                            difficulty. The patient tolerated the procedure                            well. The quality of the bowel preparation was                            adequate. Scope In: 7:44:59 AM Scope Out: 8:14:21 AM Scope Withdrawal Time: 0 hours 22 minutes 20 seconds  Total Procedure Duration: 0 hours 29 minutes 22 seconds  Findings:      The perianal and digital rectal examinations were normal.      Two sessile polyps were found in the sigmoid colon and descending colon.       The polyps were 2 to 6 mm in size. These polyps were removed with a cold       snare. Resection and retrieval were complete.      Scattered medium-mouthed diverticula were found in the sigmoid colon,       descending colon and ascending colon.      The retroflexed view of the distal rectum and anal verge was normal and       showed no anal or rectal abnormalities. Impression:               - Two 2 to 6 mm polyps in the sigmoid colon and in                            the descending colon, removed with a cold snare.                            Resected and retrieved.                           - Diverticulosis in the sigmoid colon, in the                            descending colon and in the ascending colon.                           - The distal rectum and anal verge are normal on  retroflexion view. Moderate Sedation:      Per Anesthesia Care Recommendation:           - Discharge patient to home (ambulatory).                           - Resume previous diet.                           - Await pathology results.                           - Repeat colonoscopy date to be determined after                            pending pathology results are reviewed for                            screening purposes. Procedure Code(s):        --- Professional ---                           [phone], Colonoscopy, flexible; with removal of                            tumor(s), polyp(s),  or other lesion(s) by snare                            technique Diagnosis Code(s):        --- Professional ---                           D12.5, Benign neoplasm of sigmoid colon                           D12.4, Benign neoplasm of descending colon                           R19.5, Other fecal abnormalities                           K57.30, Diverticulosis of large intestine without                            perforation or abscess without bleeding CPT copyright 2022 American Medical Association. All rights reserved. The codes documented in this report are preliminary and upon coder review may  be revised to meet current compliance requirements. Toribio Fortune, MD Toribio Fortune,  07/17/2024 8:23:06 AM This report has been signed electronically. Number of Addenda: 0

## 2024-07-18 ENCOUNTER — Ambulatory Visit (INDEPENDENT_AMBULATORY_CARE_PROVIDER_SITE_OTHER): Payer: Self-pay | Admitting: Gastroenterology

## 2024-07-18 ENCOUNTER — Encounter (HOSPITAL_COMMUNITY): Payer: Self-pay | Admitting: Gastroenterology

## 2024-07-18 LAB — SURGICAL PATHOLOGY

## 2024-07-19 NOTE — Progress Notes (Signed)
 7 yr TCS noted in recall Patient result letter mailed procedure note and pathology result faxed to PCP

## 2024-07-25 DIAGNOSIS — I10 Essential (primary) hypertension: Secondary | ICD-10-CM | POA: Diagnosis not present

## 2024-08-01 ENCOUNTER — Encounter (INDEPENDENT_AMBULATORY_CARE_PROVIDER_SITE_OTHER): Payer: Self-pay | Admitting: Gastroenterology

## 2024-08-03 DIAGNOSIS — R748 Abnormal levels of other serum enzymes: Secondary | ICD-10-CM | POA: Diagnosis not present

## 2024-08-03 DIAGNOSIS — E119 Type 2 diabetes mellitus without complications: Secondary | ICD-10-CM | POA: Diagnosis not present

## 2024-08-03 DIAGNOSIS — M25511 Pain in right shoulder: Secondary | ICD-10-CM | POA: Diagnosis not present

## 2024-08-03 DIAGNOSIS — I10 Essential (primary) hypertension: Secondary | ICD-10-CM | POA: Diagnosis not present

## 2024-08-03 DIAGNOSIS — E782 Mixed hyperlipidemia: Secondary | ICD-10-CM | POA: Diagnosis not present

## 2024-09-07 DIAGNOSIS — F329 Major depressive disorder, single episode, unspecified: Secondary | ICD-10-CM | POA: Diagnosis not present

## 2024-09-07 DIAGNOSIS — F4310 Post-traumatic stress disorder, unspecified: Secondary | ICD-10-CM | POA: Diagnosis not present

## 2024-10-03 DIAGNOSIS — G4733 Obstructive sleep apnea (adult) (pediatric): Secondary | ICD-10-CM | POA: Diagnosis not present
